# Patient Record
Sex: Female | Born: 1935 | Race: White | Hispanic: No | Marital: Married | Smoking: Never smoker
Health system: Southern US, Community
[De-identification: ages and names within clinical notes are randomized; demographics above are authoritative.]

## PROBLEM LIST (undated history)

## (undated) DIAGNOSIS — N393 Stress incontinence (female) (male): Secondary | ICD-10-CM

## (undated) DIAGNOSIS — K635 Polyp of colon: Secondary | ICD-10-CM

## (undated) DIAGNOSIS — I272 Pulmonary hypertension, unspecified: Secondary | ICD-10-CM

## (undated) DIAGNOSIS — R42 Dizziness and giddiness: Secondary | ICD-10-CM

## (undated) DIAGNOSIS — C50919 Malignant neoplasm of unspecified site of unspecified female breast: Secondary | ICD-10-CM

## (undated) DIAGNOSIS — I10 Essential (primary) hypertension: Secondary | ICD-10-CM

## (undated) DIAGNOSIS — K219 Gastro-esophageal reflux disease without esophagitis: Secondary | ICD-10-CM

## (undated) DIAGNOSIS — M1990 Unspecified osteoarthritis, unspecified site: Secondary | ICD-10-CM

## (undated) DIAGNOSIS — Z8619 Personal history of other infectious and parasitic diseases: Secondary | ICD-10-CM

## (undated) DIAGNOSIS — C801 Malignant (primary) neoplasm, unspecified: Secondary | ICD-10-CM

## (undated) DIAGNOSIS — I5032 Chronic diastolic (congestive) heart failure: Secondary | ICD-10-CM

## (undated) DIAGNOSIS — I8390 Asymptomatic varicose veins of unspecified lower extremity: Secondary | ICD-10-CM

## (undated) DIAGNOSIS — J45909 Unspecified asthma, uncomplicated: Secondary | ICD-10-CM

## (undated) DIAGNOSIS — I071 Rheumatic tricuspid insufficiency: Secondary | ICD-10-CM

## (undated) DIAGNOSIS — I4891 Unspecified atrial fibrillation: Secondary | ICD-10-CM

## (undated) DIAGNOSIS — N1832 Chronic kidney disease, stage 3b: Secondary | ICD-10-CM

## (undated) DIAGNOSIS — I4819 Other persistent atrial fibrillation: Secondary | ICD-10-CM

## (undated) HISTORY — DX: Polyp of colon: K63.5

## (undated) HISTORY — DX: Dizziness and giddiness: R42

## (undated) HISTORY — DX: Personal history of other infectious and parasitic diseases: Z86.19

## (undated) HISTORY — DX: Asymptomatic varicose veins of unspecified lower extremity: I83.90

## (undated) HISTORY — DX: Unspecified atrial fibrillation: I48.91

## (undated) HISTORY — DX: Unspecified osteoarthritis, unspecified site: M19.90

## (undated) HISTORY — PX: BREAST SURGERY: SHX581

## (undated) HISTORY — DX: Malignant (primary) neoplasm, unspecified: C80.1

## (undated) HISTORY — DX: Unspecified asthma, uncomplicated: J45.909

## (undated) HISTORY — DX: Gastro-esophageal reflux disease without esophagitis: K21.9

## (undated) HISTORY — DX: Essential (primary) hypertension: I10

## (undated) HISTORY — DX: Malignant neoplasm of unspecified site of unspecified female breast: C50.919

## (undated) HISTORY — DX: Stress incontinence (female) (male): N39.3

---

## 1970-09-07 HISTORY — PX: ABDOMINAL HYSTERECTOMY: SHX81

## 1997-12-06 ENCOUNTER — Encounter: Admission: RE | Admit: 1997-12-06 | Discharge: 1998-03-06 | Payer: Self-pay | Admitting: Radiation Oncology

## 1998-04-01 ENCOUNTER — Other Ambulatory Visit: Admission: RE | Admit: 1998-04-01 | Discharge: 1998-04-01 | Payer: Self-pay | Admitting: Hematology and Oncology

## 1998-05-20 ENCOUNTER — Ambulatory Visit (HOSPITAL_COMMUNITY): Admission: RE | Admit: 1998-05-20 | Discharge: 1998-05-20 | Payer: Self-pay | Admitting: Family Medicine

## 1999-10-20 ENCOUNTER — Encounter: Admission: RE | Admit: 1999-10-20 | Discharge: 1999-10-20 | Payer: Self-pay | Admitting: *Deleted

## 1999-10-20 ENCOUNTER — Encounter: Payer: Self-pay | Admitting: Family Medicine

## 2000-04-21 ENCOUNTER — Encounter: Admission: RE | Admit: 2000-04-21 | Discharge: 2000-04-21 | Payer: Self-pay | Admitting: *Deleted

## 2000-04-21 ENCOUNTER — Encounter: Payer: Self-pay | Admitting: *Deleted

## 2000-04-27 ENCOUNTER — Ambulatory Visit (HOSPITAL_COMMUNITY): Admission: RE | Admit: 2000-04-27 | Discharge: 2000-04-27 | Payer: Self-pay | Admitting: *Deleted

## 2000-07-22 ENCOUNTER — Ambulatory Visit (HOSPITAL_COMMUNITY): Admission: RE | Admit: 2000-07-22 | Discharge: 2000-07-22 | Payer: Self-pay | Admitting: Gastroenterology

## 2000-10-19 ENCOUNTER — Encounter: Admission: RE | Admit: 2000-10-19 | Discharge: 2000-10-19 | Payer: Self-pay | Admitting: *Deleted

## 2000-10-19 ENCOUNTER — Encounter: Payer: Self-pay | Admitting: *Deleted

## 2001-04-22 ENCOUNTER — Encounter: Payer: Self-pay | Admitting: *Deleted

## 2001-04-22 ENCOUNTER — Encounter: Admission: RE | Admit: 2001-04-22 | Discharge: 2001-04-22 | Payer: Self-pay | Admitting: *Deleted

## 2001-04-27 ENCOUNTER — Ambulatory Visit (HOSPITAL_COMMUNITY): Admission: RE | Admit: 2001-04-27 | Discharge: 2001-04-27 | Payer: Self-pay | Admitting: *Deleted

## 2001-04-27 ENCOUNTER — Encounter: Payer: Self-pay | Admitting: *Deleted

## 2001-09-20 ENCOUNTER — Ambulatory Visit (HOSPITAL_COMMUNITY): Admission: RE | Admit: 2001-09-20 | Discharge: 2001-09-20 | Payer: Self-pay | Admitting: Family Medicine

## 2001-09-20 ENCOUNTER — Encounter: Payer: Self-pay | Admitting: Family Medicine

## 2002-04-25 ENCOUNTER — Encounter: Admission: RE | Admit: 2002-04-25 | Discharge: 2002-04-25 | Payer: Self-pay | Admitting: Family Medicine

## 2002-04-25 ENCOUNTER — Encounter: Payer: Self-pay | Admitting: Family Medicine

## 2002-10-27 ENCOUNTER — Other Ambulatory Visit: Admission: RE | Admit: 2002-10-27 | Discharge: 2002-10-27 | Payer: Self-pay | Admitting: Family Medicine

## 2003-06-19 ENCOUNTER — Encounter: Admission: RE | Admit: 2003-06-19 | Discharge: 2003-06-19 | Payer: Self-pay | Admitting: Family Medicine

## 2003-06-19 ENCOUNTER — Encounter: Payer: Self-pay | Admitting: Family Medicine

## 2004-07-16 ENCOUNTER — Encounter: Admission: RE | Admit: 2004-07-16 | Discharge: 2004-07-16 | Payer: Self-pay | Admitting: Family Medicine

## 2005-07-20 ENCOUNTER — Encounter: Admission: RE | Admit: 2005-07-20 | Discharge: 2005-07-20 | Payer: Self-pay | Admitting: Family Medicine

## 2006-05-21 ENCOUNTER — Inpatient Hospital Stay (HOSPITAL_COMMUNITY): Admission: EM | Admit: 2006-05-21 | Discharge: 2006-05-22 | Payer: Self-pay | Admitting: *Deleted

## 2008-04-19 ENCOUNTER — Encounter: Admission: RE | Admit: 2008-04-19 | Discharge: 2008-04-19 | Payer: Self-pay | Admitting: Family Medicine

## 2009-05-03 ENCOUNTER — Encounter: Admission: RE | Admit: 2009-05-03 | Discharge: 2009-05-03 | Payer: Self-pay | Admitting: Family Medicine

## 2009-11-12 ENCOUNTER — Encounter: Admission: RE | Admit: 2009-11-12 | Discharge: 2009-11-12 | Payer: Self-pay | Admitting: Family Medicine

## 2011-01-23 NOTE — Procedures (Signed)
[place]. [facility]  Patient:    Karina Chase, Karina Chase                     MRN: [id number] Proc. Date: 07/22/00 Adm. Date:  [id number] Attending:  Orland Mustard CC:         Willis Modena. Dreiling, M.D.   Procedure Report  PROCEDURE:  Esophagogastroduodenoscopy.  MEDICATIONS:  Hurricane spray, fentanyl 50 mcg, Versed 5 mg IV.  INDICATIONS:  Persistent history of esophageal reflux symptoms.  DESCRIPTION OF PROCEDURE:  The procedure had been explained to the patient and consent obtained.  With the patient in the left lateral decubitus position the Olympus video endoscope was inserted and advanced under direct visualization.  The stomach was entered, pylorus identified and passed.  The duodenum, including the bulb and second portion, were seen well.  The pyloric channel was identified and passed.  The duodenum, duodenal bulb and second portion were normal.  The scope was withdrawn into the stomach.  There was no gastric outlet obstruction.  The antrum was normal.  The fundus and cardia seen well in retroflexed view and were normal.  The body of the stomach was normal.  There was a hiatal hernia.  The wire passed in patchulous GE junction.  The distal esophagus was essentially normal.  The proximal esophagus seen well upon removal and was normal.  The patient tolerated the procedure well.  She was maintained on low-flow oxygen and pulse oximetry throughout the procedure, with no obvious problems.  ASSESSMENT:  Hiatal hernia with probable esophageal reflux disease.  No other obvious abnormalities.  PLAN: 1. Will continue the patient on Prevacid.  Will give out reflux sheet. She    is to be seen back as needed. 2. Will proceed at this time with colonoscopy as planned. DD:  07/22/00 TD:  07/22/00 Job: [id number]

## 2011-01-23 NOTE — Discharge Summary (Signed)
NAMESIDRA, Karina Chase              ACCOUNT NO.:  [id number]   MEDICAL RECORD NO.:  [id number]          PATIENT TYPE:  INP   LOCATION:  3729                         FACILITY:  [facility]   PHYSICIAN:  Armanda Magic, M.D.     DATE OF BIRTH:  06-23-1925   DATE OF ADMISSION:  05/21/2006  DATE OF DISCHARGE:  05/22/2006                                 DISCHARGE SUMMARY   DISCHARGE DIAGNOSIS:  1. Paroxysmal atrial fibrillation with spontaneous conversion to normal      sinus rhythm.  2. Chest pain, resolved, adenosine Cardiolite shows no ischemia, ejection      fraction 66%, there is a possible inferior wall area of hypokinesis.  3. Hypertension.  4. Long term medication use.  5. Early family history of coronary artery disease.  6. Allergy to PENICILLIN.  7. Reflux, hiatal hernia.  8. History of breast cancer status post radiation treatment, no      chemotherapy treatment.   Karina Chase is a 77 year old female with no known history of coronary  disease who awoke at 3:00 a.m. on the day of admission with palpitations and  substernal chest pain.  No other symptoms.  She presented to emergency room  at 9 a.m. with an EKG showing atrial fibrillation with ventricular response  in the 140s and then she spontaneously converted to normal sinus rhythm.  Since that time, she has been pain free.  She had had another episode  several years ago very similar to this.  At that time, an outpatient  Cardiolite was negative.   The patient remained in the hospital overnight and an adenosine Cardiolite  was negative for ischemia.  In addition, cardiac isoenzymes were negative.  Total cholesterol 174, triglycerides 110, HDL 46, LDL 106.  TSH 3.530.  The  patient was monitored in the hospital overnight and with the Cardiolite  being negative, it was felt that the patient could go home.   DISCHARGE MEDICATIONS:  Enteric coated aspirin 325 mg one tablet daily,  Norvasc 5 mg a day.   DISCHARGE INSTRUCTIONS:   Follow up with Dr. Eldridge Dace in 1-2 weeks with an  echo and an office visit.  She is to call for any questions or concerns.     Guy Franco, P.A.      Armanda Magic, M.D.  Electronically Signed   LB/MEDQ  D:  06/30/2006  T:  07/01/2006  Job:  [id number]   cc:   Corky Crafts, MD

## 2011-01-23 NOTE — Procedures (Signed)
[place]. [facility]  Patient:    Karina Chase, Karina Chase                     MRN: [id number] Proc. Date: 07/22/00 Adm. Date:  [id number] Attending:  Orland Mustard CC:         Willis Modena. Dreiling, M.D.                           Procedure Report  PROCEDURE PERFORMED:  Colonoscopy.  ENDOSCOPIST:  Llana Aliment. Randa Evens, M.D.  MEDICATIONS USED:  Patient received a total of fentanyl 80 mcg and Versed 6 mg for both procedurs.  INDICATIONS:  Strong family history of colon cancer in a sister.  DESCRIPTION OF PROCEDURE:  The procedure had been explained to the patient and consent obtained.  With the patient in the left lateral decubitus position, the Olympus pediatric video colonoscope was inserted and advanced under direct visualization.  The prep was excellent and we were able to advance to the cecum using abdominal pressure and position changes.  The ileocecal valve was seen.  We advanced just to the top of the ileocecal valve and nothing was seen grossly in the distance in the cecum.  The scope was withdrawn.  The cecum, ascending colon, hepatic flexure, transverse colon, splenic flexure, descending and sigmoid colon were seen well.  There were scattered diverticula.  No polyps or other lesions were seen.  Scope withdrawn, patient tolerated the procedure well.  Maintained on low flow oxygen and pulse oximeter through both procedures without abnormalities.  ASSESSMENT:  No evidence of colon polyps.  PLAN:  Due to her family history will recommend repeating in five years.    PLAN: DD:  07/22/00 TD:  07/22/00 Job: [id number]

## 2012-09-07 HISTORY — PX: SKIN CANCER EXCISION: SHX779

## 2013-03-16 ENCOUNTER — Encounter (INDEPENDENT_AMBULATORY_CARE_PROVIDER_SITE_OTHER): Payer: Self-pay

## 2013-03-28 ENCOUNTER — Telehealth (INDEPENDENT_AMBULATORY_CARE_PROVIDER_SITE_OTHER): Payer: Self-pay | Admitting: General Surgery

## 2013-03-28 ENCOUNTER — Ambulatory Visit (INDEPENDENT_AMBULATORY_CARE_PROVIDER_SITE_OTHER): Payer: Medicare Other | Admitting: General Surgery

## 2013-03-28 ENCOUNTER — Encounter (INDEPENDENT_AMBULATORY_CARE_PROVIDER_SITE_OTHER): Payer: Self-pay | Admitting: General Surgery

## 2013-03-28 VITALS — BP 138/80 | HR 76 | Temp 97.8°F | Resp 15 | Ht 63.0 in | Wt 162.4 lb

## 2013-03-28 DIAGNOSIS — R1031 Right lower quadrant pain: Secondary | ICD-10-CM

## 2013-03-28 HISTORY — DX: Right lower quadrant pain: R10.31

## 2013-03-28 NOTE — Progress Notes (Signed)
Patient ID: Karina Chase, female   DOB: 15-Sep-1924, 79 y.o.   MRN: [id number]  Chief Complaint  Patient presents with  . New Evaluation    eval poss femoral hernia?    HPI Karina Chase is a 79 y.o. female.  She is referred by [name] for evaluation of right groin pain, possible hernia.  This patient has a history of breast cancer. She underwent lumpectomy by me in 1998 and had radiation therapy. No recurrence noted to date.  She states that she has had intermittent pain in her right groin for about 2 years she thinks she feels a small lump for about 2 months. This bothers her later in the day and  seems to be getting a little bit worse and is now seems to be referred to the vagina. She denies back pain but rarely has right buttock pain.  She saw [name] who thought he felt a hernia. Pelvic exam was not possible due to an atrophic introitus. She is here today with her daughter.  HPI  Past Medical History  Diagnosis Date  . Hypertension   . Asthma   . Varicose veins   . Cancer     Basal cell skin cancer  . Breast cancer   . GERD (gastroesophageal reflux disease)   . Stress incontinence   . Vertigo   . History of shingles   . Atrial fibrillation     intermittent  . Colon polyp   . Arthritis     Past Surgical History  Procedure Laterality Date  . Abdominal hysterectomy  1972    BSO  . Breast surgery      lumpectomy  . Skin cancer excision  2014    Family History  Problem Relation Age of Onset  . Stroke Mother   . Heart disease Father   . Cancer Maternal Grandmother     breast    Social History History  Substance Use Topics  . Smoking status: Never Smoker   . Smokeless tobacco: Never Used  . Alcohol Use: No    Allergies  Allergen Reactions  . Keflex (Cephalexin)   . Penicillins   . Prilosec (Omeprazole)     Current Outpatient Prescriptions  Medication Sig Dispense Refill  . amLODipine (NORVASC) 5 MG tablet Take 5 mg by mouth daily.       Marland Kitchen aspirin 325 MG tablet Take 325 mg by mouth daily.      . Clobetasol Prop Emollient Base 0.05 % emollient cream Apply topically 2 (two) times daily.      Marland Kitchen diltiazem (CARDIZEM CD) 120 MG 24 hr capsule Take 120 mg by mouth daily.      . nitroGLYCERIN (NITROSTAT) 0.4 MG SL tablet Place 0.4 mg under the tongue every 5 (five) minutes as needed for chest pain.       No current facility-administered medications for this visit.    Review of Systems Review of Systems  Constitutional: Negative for fever, chills and unexpected weight change.  HENT: Negative for hearing loss, congestion, sore throat, trouble swallowing and voice change.   Eyes: Negative for visual disturbance.  Respiratory: Negative for cough and wheezing.   Cardiovascular: Negative for chest pain, palpitations and leg swelling.  Gastrointestinal: Negative for nausea, vomiting, abdominal pain, diarrhea, constipation, blood in stool, abdominal distention and anal bleeding.  Genitourinary: Negative for hematuria, vaginal bleeding and difficulty urinating.  Musculoskeletal: Positive for myalgias and arthralgias.  Skin: Negative for rash and wound.  Neurological: Negative  for seizures, syncope and headaches.  Hematological: Negative for adenopathy. Does not bruise/bleed easily.  Psychiatric/Behavioral: Negative for confusion.    Blood pressure 138/80, pulse 76, temperature 97.8 F (36.6 C), temperature source Temporal, resp. rate 15, height 5\' 3"  (1.6 m), weight 162 lb 6.4 oz (73.664 kg).  Physical Exam Physical Exam  Constitutional: She is oriented to person, place, and time. She appears well-developed and well-nourished. No distress.  HENT:  Head: Normocephalic and atraumatic.  Nose: Nose normal.  Mouth/Throat: No oropharyngeal exudate.  Eyes: Conjunctivae and EOM are normal. Pupils are equal, round, and reactive to light. Left eye exhibits no discharge. No scleral icterus.  Neck: Neck supple. No JVD present. No tracheal  deviation present. No thyromegaly present.  Cardiovascular: Intact distal pulses.   Murmur heard. Somewhat irregular rhythm. Soft systolic murmur.  Pulmonary/Chest: Effort normal and breath sounds normal. No respiratory distress. She has no wheezes. She has no rales. She exhibits no tenderness.  Abdominal: Soft. Bowel sounds are normal. She exhibits no distension and no mass. There is no tenderness. There is no rebound and no guarding.  Lower midline scar. No mass or hernia. No tenderness.  Genitourinary:  Examined supine and standing, cough and Valsalva. I cannot detect a hernia in either femoral or inguinal. She is somewhat tender near the right pubic tubercle. Excellent femoral pulse but no mass or hernia medial to that. No inguinal mass in either. Left inguinal area unremarkable exam.  Musculoskeletal: She exhibits no edema and no tenderness.  Lymphadenopathy:    She has no cervical adenopathy.  Neurological: She is alert and oriented to person, place, and time. She exhibits normal muscle tone. Coordination normal.  Skin: Skin is warm. No rash noted. She is not diaphoretic. No erythema. No pallor.  Psychiatric: She has a normal mood and affect. Her behavior is normal. Judgment and thought content normal.    Data Reviewed Dr. Johnathan Hausen office notes.  Assessment    Right groin pain. Etiology unclear. Occult hernia possible but not demonstrated on exam. Musculoskeletal pain likely.  Intermittent atrial fibrillation  Hypertension  Asthma  History of breast cancer  Status post abdominal hysterectomy  GERD     Plan    Since her symptoms are progressive, I think that further evaluation is warranted.  She'll be scheduled for CT scan of the pelvis with contrast to make sure we are not missing an abdominal wall hernia or a bony abnormality  She was advised to contact [name] for referral to a gynecologist for a pelvic exam  Return to see me after the CT scan is done.         Angelia Mould. Derrell Lolling, M.D., [facility] General and Minimally invasive Surgery Breast and Colorectal Surgery Office:   [phone] Pager:   [phone]  03/28/2013, 3:02 PM

## 2013-03-28 NOTE — Patient Instructions (Signed)
Examination today does not reveal an obvious hernia.  Because your right groin pain and vaginal pain have been getting worse, I think that further evaluation is indicated  You will be scheduled for a CT scan of the pelvis to make sure that we're not missing a hernia or some bony abnormality.  Call [name] and ask him to refer you to a gynecologist to make sure there is no vaginal problem.  Return to see Dr. Derrell Lolling after the CT scan is done.

## 2013-03-28 NOTE — Telephone Encounter (Signed)
LMOM for patient to call back and ask for triage. To make her aware per Deanna, RN, that patient is set up for her CT pelvis on 03/31/2013 at 2:30 pm at [facility]. Patient needs to be instructed to drink contrast at 12:30 pm and 1:30 pm and no solid food after 10:30 am. Awaiting call back.

## 2013-03-29 NOTE — Telephone Encounter (Signed)
The pt called back and I gave her the CT information.  She understands and will call back if she can't get a ride there.

## 2013-03-31 ENCOUNTER — Other Ambulatory Visit: Payer: Self-pay

## 2013-03-31 ENCOUNTER — Ambulatory Visit
Admission: RE | Admit: 2013-03-31 | Discharge: 2013-03-31 | Disposition: A | Discharge status: Home / Self Care | Payer: Medicare Other | Source: Ambulatory Visit | Attending: General Surgery | Admitting: General Surgery

## 2013-03-31 DIAGNOSIS — R1031 Right lower quadrant pain: Secondary | ICD-10-CM

## 2013-03-31 MED ORDER — IOHEXOL 300 MG/ML  SOLN
100.0000 mL | Freq: Once | INTRAMUSCULAR | Status: AC | PRN
  Administered 2013-03-31: 100 mL via INTRAVENOUS

## 2013-04-03 ENCOUNTER — Telehealth (INDEPENDENT_AMBULATORY_CARE_PROVIDER_SITE_OTHER): Payer: Self-pay

## 2013-04-03 NOTE — Telephone Encounter (Signed)
Message copied by Ivory Broad on Mon Apr 03, 2013  9:23 AM ------      Message from: Ernestene Mention      Created: Fri Mar 31, 2013  5:25 PM       Call radiology reports to patient. ------

## 2013-04-03 NOTE — Telephone Encounter (Signed)
Patient informed of negative Ct results. Patient states she is going to see her GYN so we can cancel her appointment as advised.

## 2013-04-03 NOTE — Telephone Encounter (Signed)
I left a message for the pt to call and I can tell her the CT was normal.  There is no inguinal hernia and no explanation for her inguinal or vaginal pain.  She has a follow up appointment with Dr Derrell Lolling and I will leave it up to her if she wants to keep it.

## 2013-04-04 ENCOUNTER — Telehealth (INDEPENDENT_AMBULATORY_CARE_PROVIDER_SITE_OTHER): Payer: Self-pay | Admitting: *Deleted

## 2013-04-04 NOTE — Telephone Encounter (Signed)
Patient called today and asked to speak with Lubertha Basque and only Lubertha Basque.  Patient states she wants to talk to Huntley Dec RN more about CT scan results.  This RN attempted to speak with patient however she declined.  Patient aware Lubertha Basque is working with a doctor this morning and will not be able to call back until at least this afternoon.  Patient states understanding and agreeable at this time.

## 2013-04-04 NOTE — Telephone Encounter (Signed)
I called the pt back.  She wanted to know whether her CT scan showed anything abnormal and I told her it didn't.  She is going to see her gyn and wants to know if she will be able to see the results.  I told her she can if she is in the same charting system otherwise she can call [facility] [facility] for the films.  She reports her gyn is in the [address] building.

## 2013-04-19 NOTE — Telephone Encounter (Signed)
Erroneous encounter

## 2013-04-25 ENCOUNTER — Encounter (INDEPENDENT_AMBULATORY_CARE_PROVIDER_SITE_OTHER): Payer: Medicare Other | Admitting: General Surgery

## 2013-09-18 ENCOUNTER — Other Ambulatory Visit: Payer: Self-pay

## 2013-09-18 DIAGNOSIS — Z1231 Encounter for screening mammogram for malignant neoplasm of breast: Secondary | ICD-10-CM

## 2013-10-05 ENCOUNTER — Ambulatory Visit
Admission: RE | Admit: 2013-10-05 | Discharge: 2013-10-05 | Disposition: A | Discharge status: Home / Self Care | Payer: Medicare Other | Source: Ambulatory Visit

## 2013-10-05 DIAGNOSIS — Z1231 Encounter for screening mammogram for malignant neoplasm of breast: Secondary | ICD-10-CM

## 2015-07-03 ENCOUNTER — Ambulatory Visit: Payer: Self-pay | Admitting: Sports Medicine

## 2015-07-04 ENCOUNTER — Ambulatory Visit (INDEPENDENT_AMBULATORY_CARE_PROVIDER_SITE_OTHER): Payer: Medicare Other

## 2015-07-04 ENCOUNTER — Encounter: Payer: Self-pay | Admitting: Sports Medicine

## 2015-07-04 ENCOUNTER — Ambulatory Visit (INDEPENDENT_AMBULATORY_CARE_PROVIDER_SITE_OTHER): Payer: Medicare Other | Admitting: Sports Medicine

## 2015-07-04 DIAGNOSIS — M21619 Bunion of unspecified foot: Secondary | ICD-10-CM

## 2015-07-04 DIAGNOSIS — L84 Corns and callosities: Secondary | ICD-10-CM | POA: Diagnosis not present

## 2015-07-04 DIAGNOSIS — M79673 Pain in unspecified foot: Secondary | ICD-10-CM

## 2015-07-04 DIAGNOSIS — M2040 Other hammer toe(s) (acquired), unspecified foot: Secondary | ICD-10-CM

## 2015-07-04 NOTE — Progress Notes (Signed)
Patient ID: Karina Chase, female   DOB: 12/30/1924, 81 y.o.   MRN: [id number] Subjective: Karina Chase is a 81 y.o. female patient who presents to office for evaluation of bilateral foot pain. Patient complains of pain at the lesions present Right>Left foot especially in the 1st interspace. Patient has tried trimming and padding with no relief in symptoms. Patient states that she has a few pairs of shoes that help make the pain over the callus areas better. Patient denies any other pedal complaints.   Review of Systems  Cardiovascular:       High , low BP   Musculoskeletal:       Swelling of feet or legs   Hematological: Bruises/bleeds easily.  All other systems reviewed and are negative.  Patient Active Problem List   Diagnosis Date Noted  . Right groin pain 03/28/2013   Current Outpatient Prescriptions on File Prior to Visit  Medication Sig Dispense Refill  . amLODipine (NORVASC) 5 MG tablet Take 5 mg by mouth daily.    Marland Kitchen aspirin 325 MG tablet Take 325 mg by mouth daily.    . Clobetasol Prop Emollient Base 0.05 % emollient cream Apply topically 2 (two) times daily.    Marland Kitchen diltiazem (CARDIZEM CD) 120 MG 24 hr capsule Take 120 mg by mouth daily.    . nitroGLYCERIN (NITROSTAT) 0.4 MG SL tablet Place 0.4 mg under the tongue every 5 (five) minutes as needed for chest pain.     No current facility-administered medications on file prior to visit.   Allergies  Allergen Reactions  . Levofloxacin Nausea And Vomiting  . Keflex [Cephalexin]   . Penicillins   . Prilosec [Omeprazole]    Objective:  General: Alert and oriented x3 in no acute distress  Dermatology: Keratotic lesions present on the right foot at dorsal aspect of 5th toe, 1st interspace at lateral aspect of hallux, plantar 2nd metatarsal, and medial 1st MPTJ/hallux and on the left at plantar 5th metatarsal head and medial 1st MTPJ/hallux with no signs of infection, no webspace macerations, no ecchymosis bilateral, all nails x  10 are short and well manicured.  Vascular: Dorsalis Pedis and Posterior Tibial pedal pulses1/4, Capillary Fill Time 4 seconds, Scant pedal hair growth bilateral, mild varicosities, no edema bilateral lower extremities, Temperature gradient within normal limits.  Neurology: Johney Maine sensation intact via light touch bilateral.  Musculoskeletal: Significant bunion with crossover onto 2nd toe right>left, rigid hammertoes bilateral with distal fat pad migration, Mild tenderness with direct palpation at the keratotic lesions  Right>Left, Muscular strength 5/5 in all groups without pain or limitation on range of motion.   Xrays  Right & left Foot 3 views    Impression: decreased osseous mineralization, significant bunion and hammertoe deformities, calcaneal spur, pes planus foot type, no fracture/dislocation, no boney destruction, no foreign body, soft tissues within normal limits.         Assessment and Plan: Problem List Items Addressed This Visit    None    Visit Diagnoses    Foot pain, unspecified laterality    -  Primary    Relevant Orders    DG Foot Complete Left (Completed)    DG Foot Complete Right (Completed)    Corns and callus        Bunion        Hammertoe, unspecified laterality          -Complete examination performed -Xrays reviewed -Discussed etiology of corns and callus secondary to foot deformities and treatement  options; risks, benefits, alternatives explained  -Parred keratoic lesions x6 using a chisel blade without incident and applied salinocaine covered with moleskin to right sub 2 lesion and left sub 5 lesion -Dispensed toe tubing and silicone toe pad to use daily  -Recommend cont with good supportive shoes -Patient to return to office 10 weeks for callus care or sooner if condition worsens. May consider custom accommodative inserts in future.   Landis Martins, DPM

## 2015-07-04 NOTE — Progress Notes (Deleted)
   Subjective:    Patient ID: Karina Chase, female    DOB: 1925-04-19, 81 y.o.   MRN: [id number]  HPI    Review of Systems  Cardiovascular:       High , low BP   Musculoskeletal:       Swelling of feet or legs   Hematological: Bruises/bleeds easily.  All other systems reviewed and are negative.      Objective:   Physical Exam        Assessment & Plan:

## 2015-07-24 IMAGING — CT CT PELVIS W/ CM
3 series · 13 of 36 positions shown, 19 images · IV contrast (omnipaque)
Comparison: None.

CLINICAL DATA: Right inguinal and vaginal pain.  Prior
hysterectomy.  History of right breast cancer.

CT PELVIS WITH CONTRAST
TECHNIQUE: Multidetector CT imaging of the pelvis was performed
using the standard protocol following the bolus administration of
intravenous contrast.
Contrast: 100mL OMNIPAQUE IOHEXOL 300 MG/ML  SOLN

[Series 3: routine pelvis · axial · 0.98mm/px · z∈[-275,-90]mm · 5 of 54 slices shown, 10 images]
[im 9/54  soft-tissue]
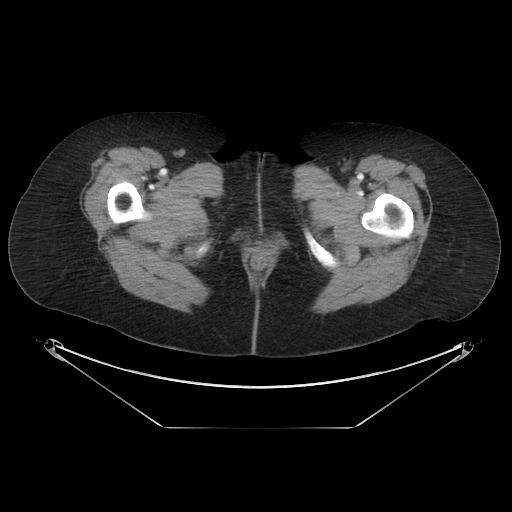
[im 9/54  bone]
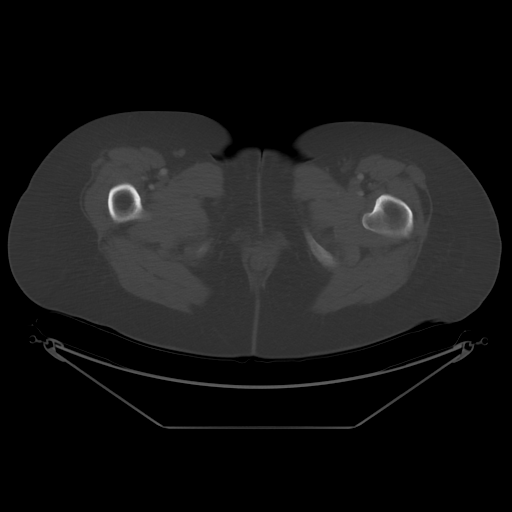
[im 18/54  soft-tissue]
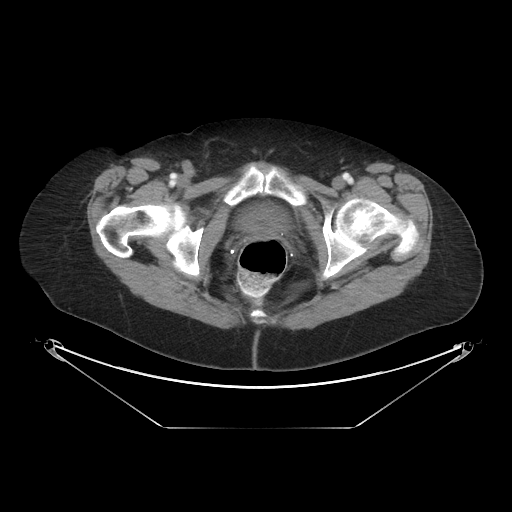
[im 18/54  lung]
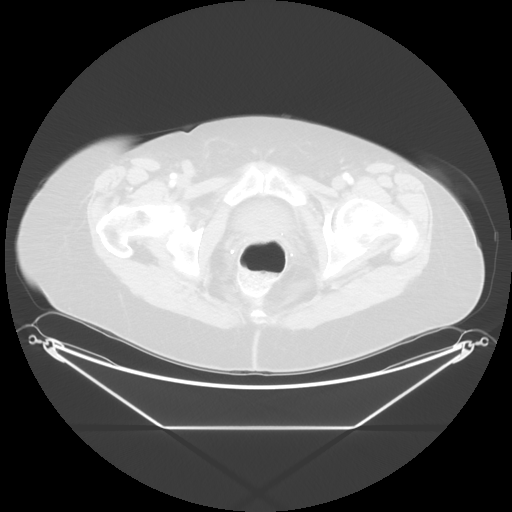
[im 27/54  soft-tissue]
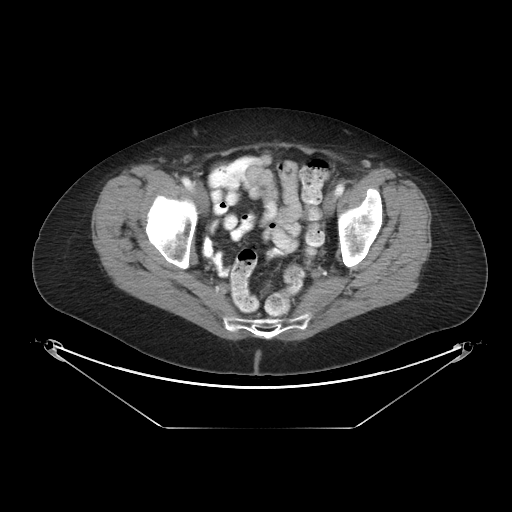
[im 27/54  lung]
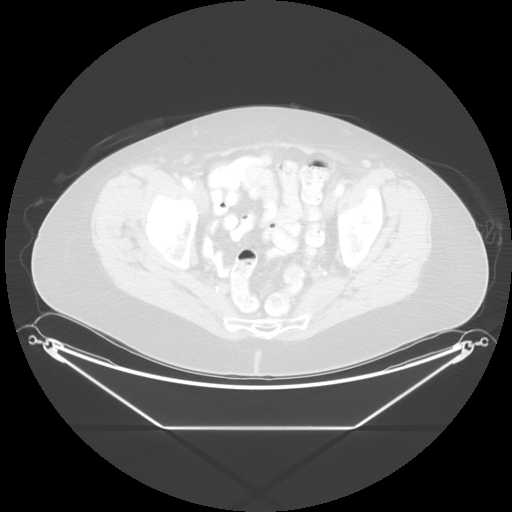
[im 36/54  soft-tissue]
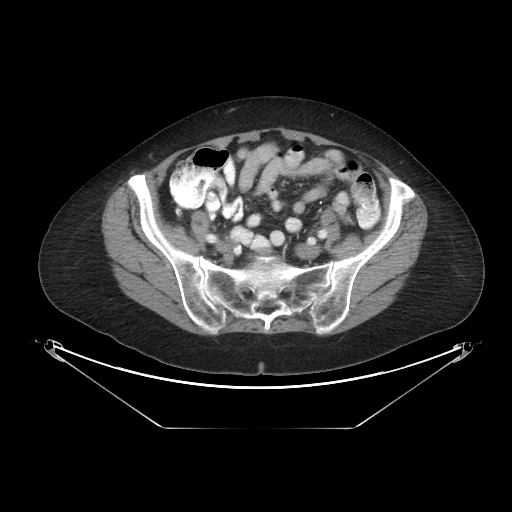
[im 36/54  lung]
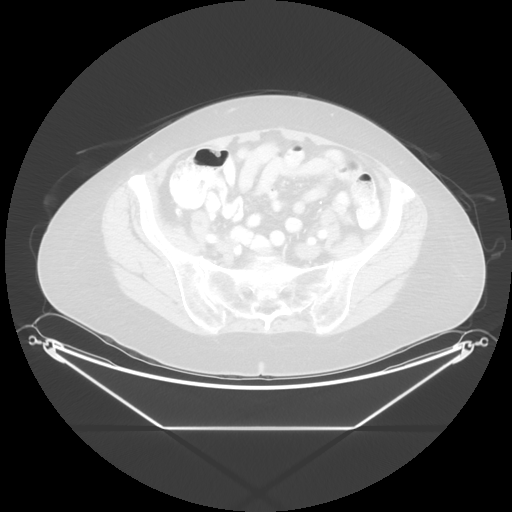
[im 45/54  soft-tissue]
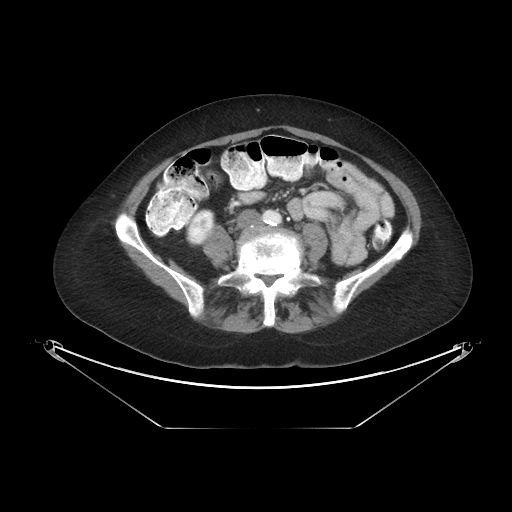
[im 45/54  lung]
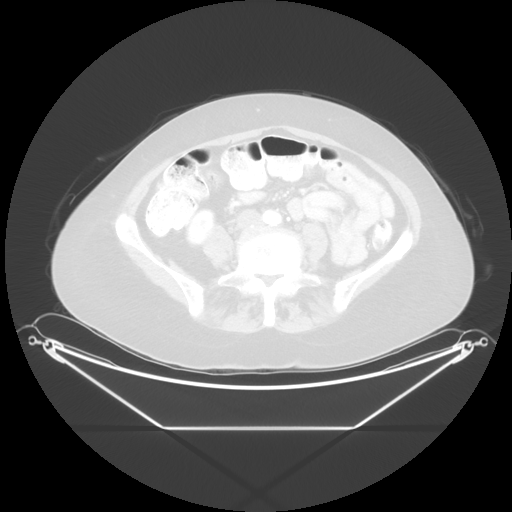

[Series 601: coronal body · coronal · 0.98mm/px · 1 of 136 slices shown, 2 images]
[im 46/136  soft-tissue]
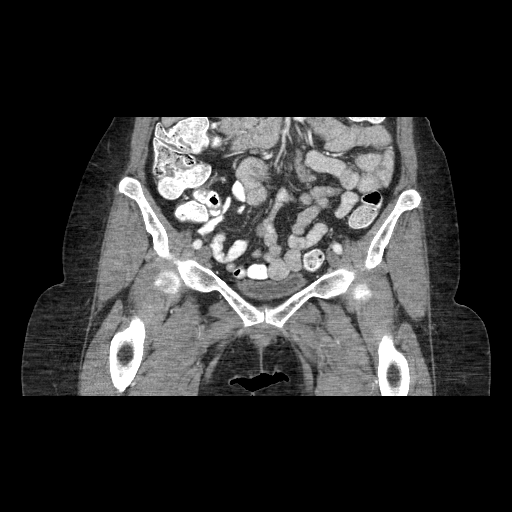
[im 46/136  bone]
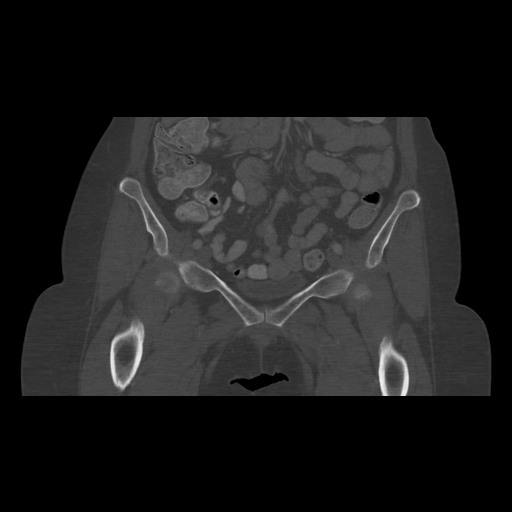

[Series 602: sagittal body · sagittal · 0.98mm/px · 7 of 194 slices shown]
[im 17/194  soft-tissue]
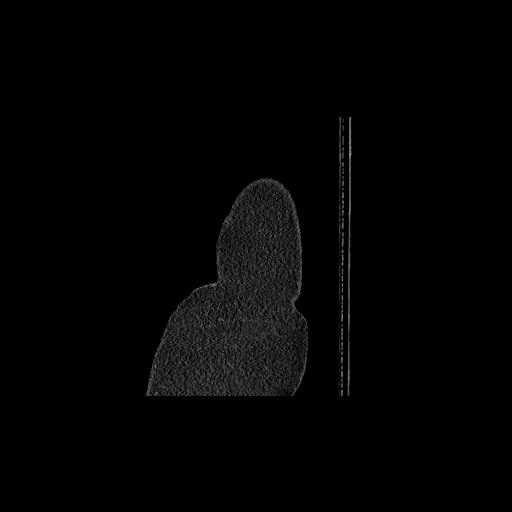
[im 41/194  soft-tissue]
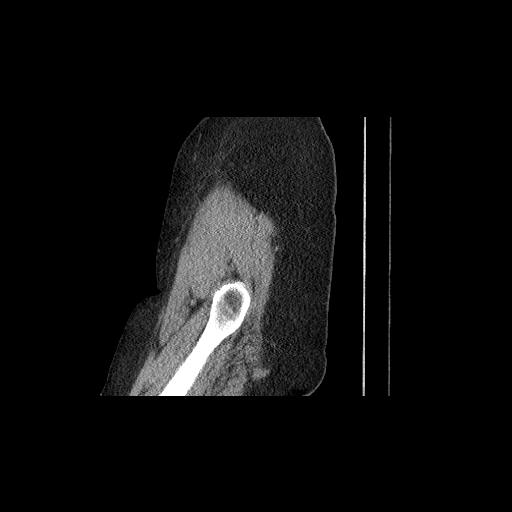
[im 65/194  soft-tissue]
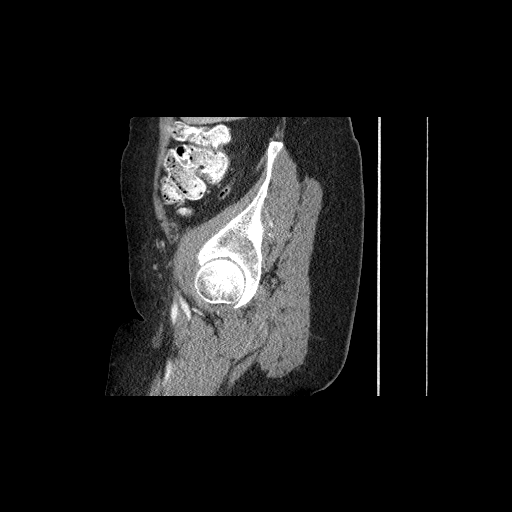
[im 89/194  soft-tissue]
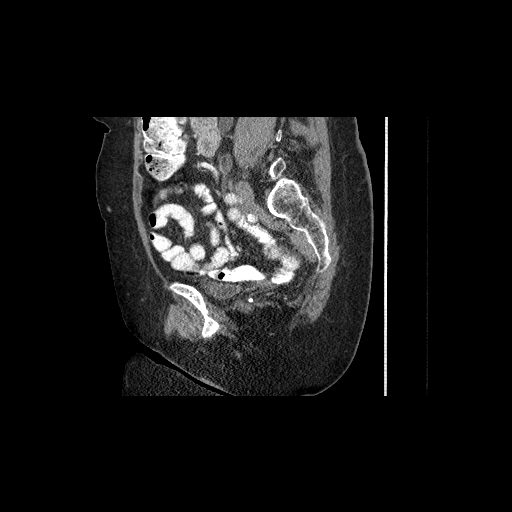
[im 105/194  soft-tissue]
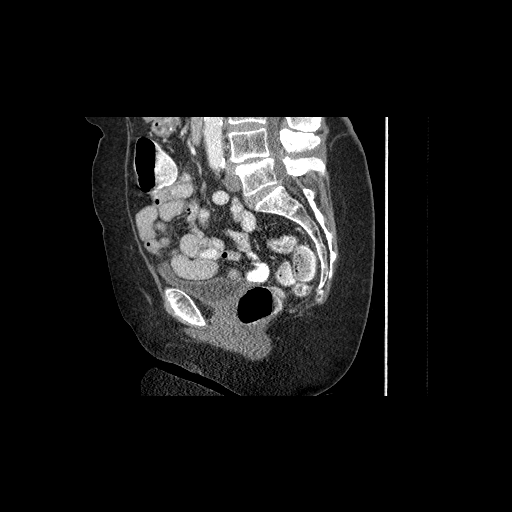
[im 129/194  soft-tissue]
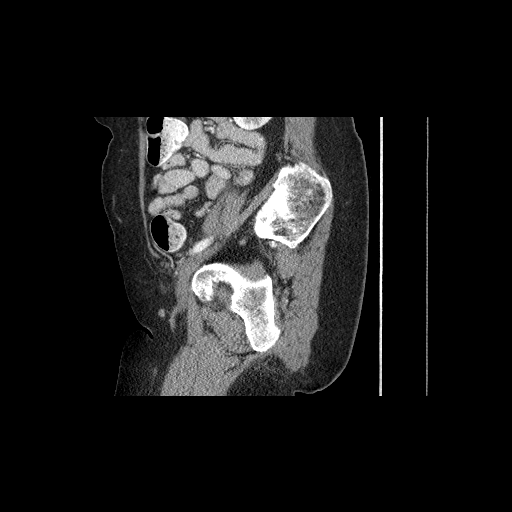
[im 153/194  soft-tissue]
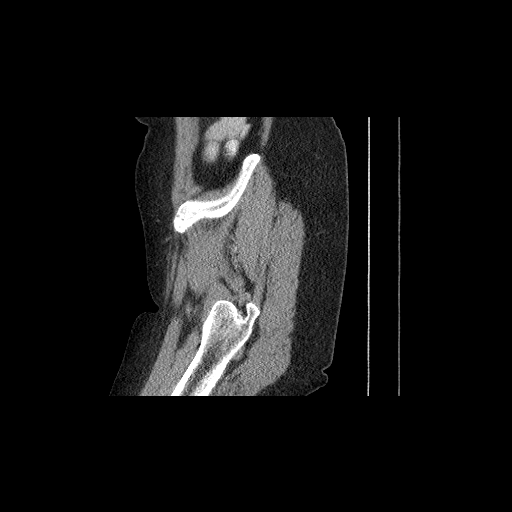

[13 of 36 positions shown; findings below may reference images not displayed]

FINDINGS: Visualized large and small bowel are unremarkable.  No
evidence of bowel obstruction.  Normal retrocecal appendix.  No
free fluid, free air or adenopathy in the lower abdomen or pelvis.

Prior hysterectomy.  No adnexal masses.  Urinary bladder has a
normal appearance.

Scattered calcifications in the distal aorta and iliac vessels
without aneurysm.

No inguinal hernia.  Small scattered inguinal lymph nodes, none
pathologically enlarged.

No acute bony abnormality degenerative disc disease changes at L4-
5.
IMPRESSION: No acute or significant abnormality within the pelvis.  No
explanation for the patient's right inguinal or vaginal pain.

## 2016-02-06 ENCOUNTER — Ambulatory Visit (INDEPENDENT_AMBULATORY_CARE_PROVIDER_SITE_OTHER): Payer: Medicare Other | Admitting: Sports Medicine

## 2016-02-06 ENCOUNTER — Encounter: Payer: Self-pay | Admitting: Sports Medicine

## 2016-02-06 DIAGNOSIS — M779 Enthesopathy, unspecified: Secondary | ICD-10-CM

## 2016-02-06 DIAGNOSIS — L84 Corns and callosities: Secondary | ICD-10-CM | POA: Diagnosis not present

## 2016-02-06 DIAGNOSIS — M21619 Bunion of unspecified foot: Secondary | ICD-10-CM

## 2016-02-06 DIAGNOSIS — M2040 Other hammer toe(s) (acquired), unspecified foot: Secondary | ICD-10-CM

## 2016-02-06 DIAGNOSIS — M7752 Other enthesopathy of left foot: Secondary | ICD-10-CM | POA: Diagnosis not present

## 2016-02-06 DIAGNOSIS — M79673 Pain in unspecified foot: Secondary | ICD-10-CM

## 2016-02-06 DIAGNOSIS — M778 Other enthesopathies, not elsewhere classified: Secondary | ICD-10-CM

## 2016-02-06 MED ORDER — TRIAMCINOLONE ACETONIDE 10 MG/ML IJ SUSP: 10.0000 mg | Freq: Once | INTRAMUSCULAR | Status: DC

## 2016-02-06 NOTE — Progress Notes (Signed)
Patient ID: Karina Chase, female   DOB: [birth year], 82 y.o.   MRN: [id number]  Subjective: Karina Chase is a 82 y.o. female patient who presents to office for evaluation of bilateral foot pain secondary to callus. Patient states that last trim of the callused areas helped a lot. She has been using toe spacers with much relief. Patient denies any other pedal complaints.    Patient Active Problem List   Diagnosis Date Noted  . Right groin pain 03/28/2013   Current Outpatient Prescriptions on File Prior to Visit  Medication Sig Dispense Refill  . amLODipine (NORVASC) 5 MG tablet Take 5 mg by mouth daily.    Marland Kitchen aspirin 325 MG tablet Take 325 mg by mouth daily.    . Clobetasol Prop Emollient Base 0.05 % emollient cream Apply topically 2 (two) times daily.    Marland Kitchen diltiazem (CARDIZEM CD) 120 MG 24 hr capsule Take 120 mg by mouth daily.    Marland Kitchen FLUZONE QUADRIVALENT injection See admin instructions.  0  . nitroGLYCERIN (NITROSTAT) 0.4 MG SL tablet Place 0.4 mg under the tongue every 5 (five) minutes as needed for chest pain.     No current facility-administered medications on file prior to visit.   Allergies  Allergen Reactions  . Levofloxacin Nausea And Vomiting  . Keflex [Cephalexin]   . Penicillins   . Prilosec [Omeprazole]    Objective:  General: Alert and oriented x3 in no acute distress  Dermatology: Keratotic lesions present on the right foot at 1st interspace at lateral aspect of hallux, plantar 2nd metatarsal and on the left at plantar 5th metatarsal head and medial 1st MTPJ/hallux with no signs of infection, no webspace macerations, no ecchymosis bilateral, all nails x 10 are short and well manicured.  Vascular: Dorsalis Pedis and Posterior Tibial pedal pulses1/4, Capillary Fill Time 4 seconds, Scant pedal hair growth bilateral, mild varicosities, no edema bilateral lower extremities, Temperature gradient within normal limits.  Neurology: Michaell Cowing sensation intact via light touch  bilateral.  Musculoskeletal: Significant bunion with crossover onto 2nd toe right>left, rigid hammertoes bilateral with distal fat pad migration, Mild tenderness with direct palpation at the keratotic lesions bilateral with left sub-met one lesion being most tender, Muscular strength 5/5 in all groups without pain or limitation on range of motion.   Assessment and Plan: Problem List Items Addressed This Visit    None    Visit Diagnoses    Foot pain, unspecified laterality    -  Primary    Corns and callus        Bunion        Hammertoe, unspecified laterality        Capsulitis of foot, left        Relevant Medications    triamcinolone acetonide (KENALOG) 10 MG/ML injection 10 mg      -Complete examination performed -Discussed etiology of corns and callus secondary to foot deformities and treatement options; risks, benefits, alternatives explained  -After oral consent and aseptic prep, injected a mixture containing 1 ml of 2%  plain lidocaine, 1 ml 0.5% plain marcaine, 0.5 ml of kenalog 10 and 0.5 ml of dexamethasone phosphate into left 1st MTPJ at area of callus without complication for symptomatic relief. Post-injection care discussed with patient.  -Parred keratoic lesions x4 using a chisel blade without incident  -Continue with toe tubing and silicone toe pad to use daily  -Recommend cont with good supportive shoes -Patient to return to office 10 weeks for callus care or  sooner if condition worsens. May consider custom accommodative inserts in future if continues to be bothersome.   Karina Chase, DPM

## 2016-05-06 ENCOUNTER — Other Ambulatory Visit: Payer: Self-pay | Admitting: Family Medicine

## 2016-05-06 ENCOUNTER — Ambulatory Visit
Admission: RE | Admit: 2016-05-06 | Discharge: 2016-05-06 | Disposition: A | Discharge status: Home / Self Care | Payer: Medicare Other | Source: Ambulatory Visit | Attending: Family Medicine | Admitting: Family Medicine

## 2016-05-06 DIAGNOSIS — R601 Generalized edema: Secondary | ICD-10-CM

## 2018-08-29 IMAGING — CR DG CHEST 2V
2 series · 2 of 2 positions shown · non-contrast
Comparison: Chest x-ray of 01/03/2007

CLINICAL DATA: Upper chest congestion for weeks, history of asthma

EXAM:
CHEST  2 VIEW

[w chest pa]
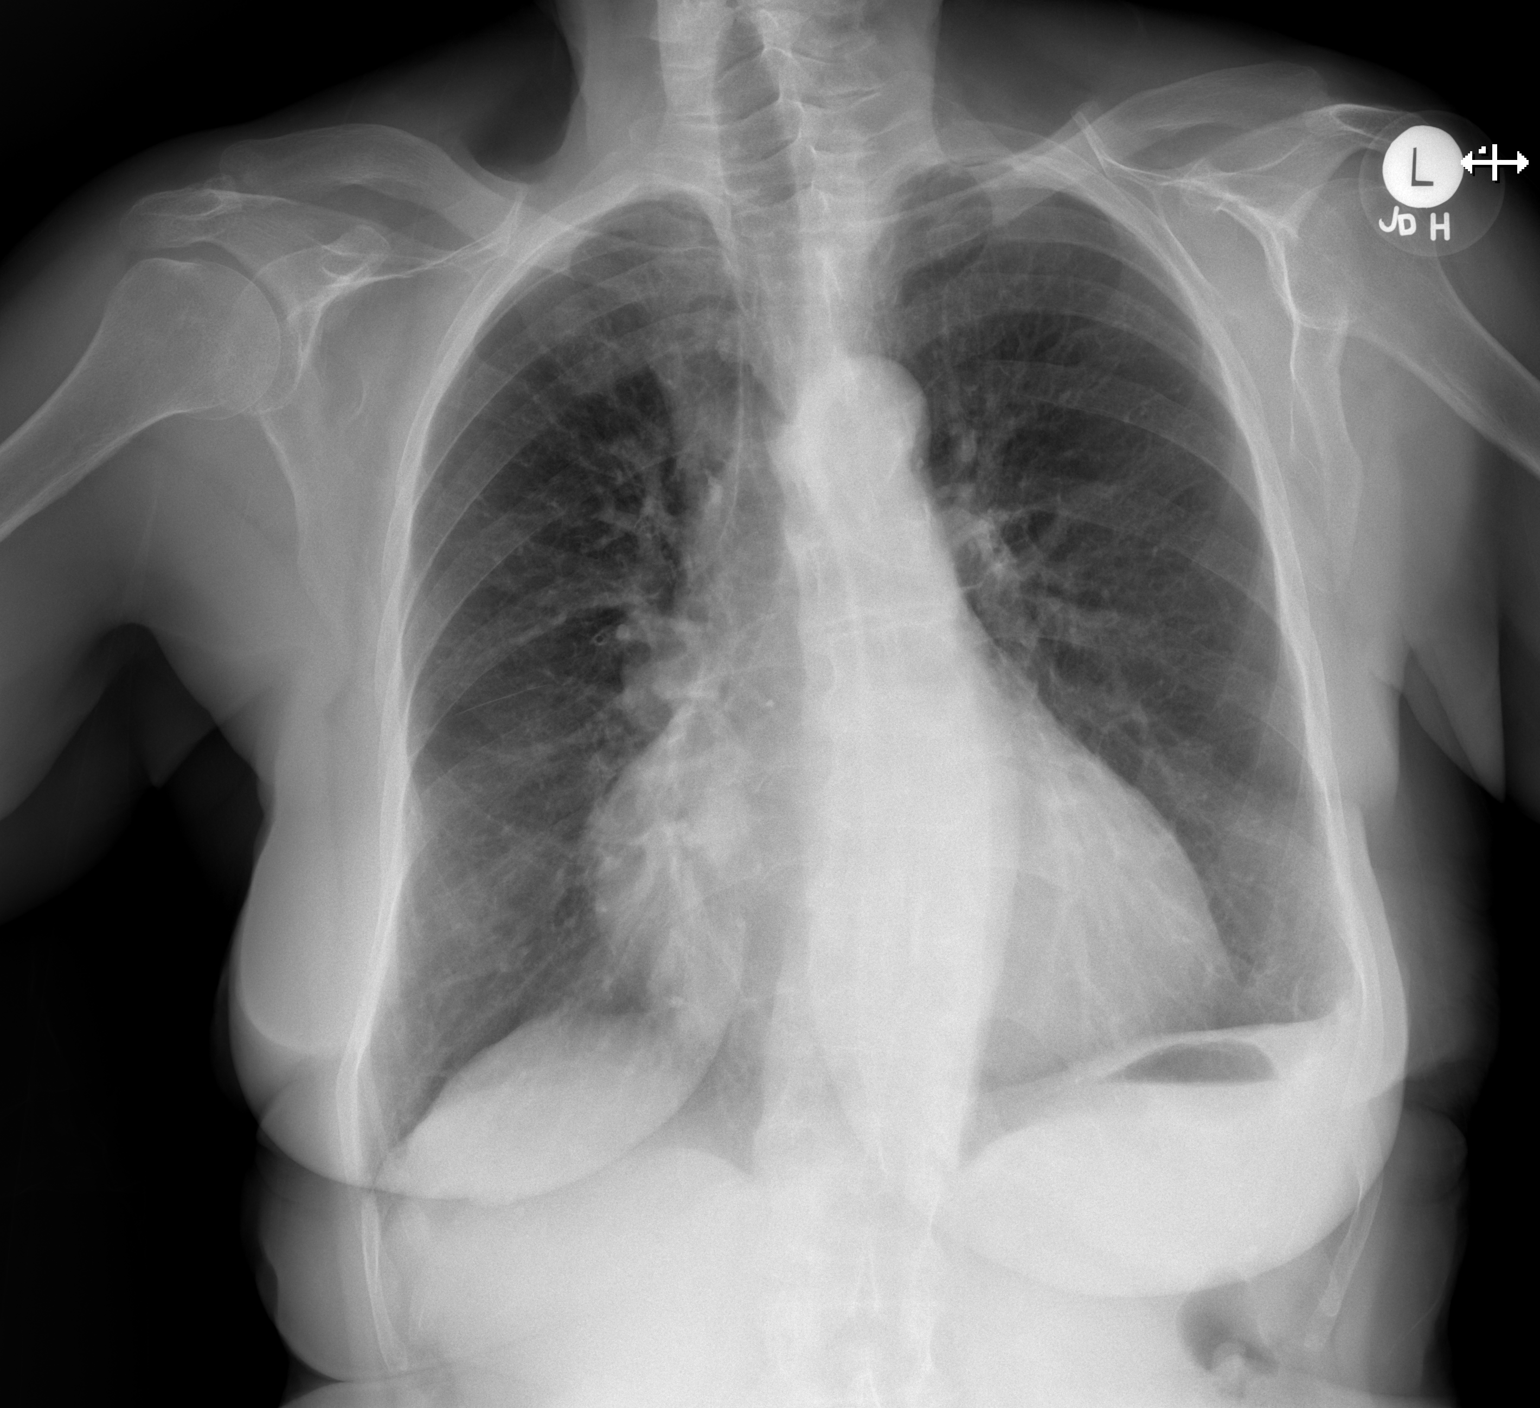

[w chest lat]
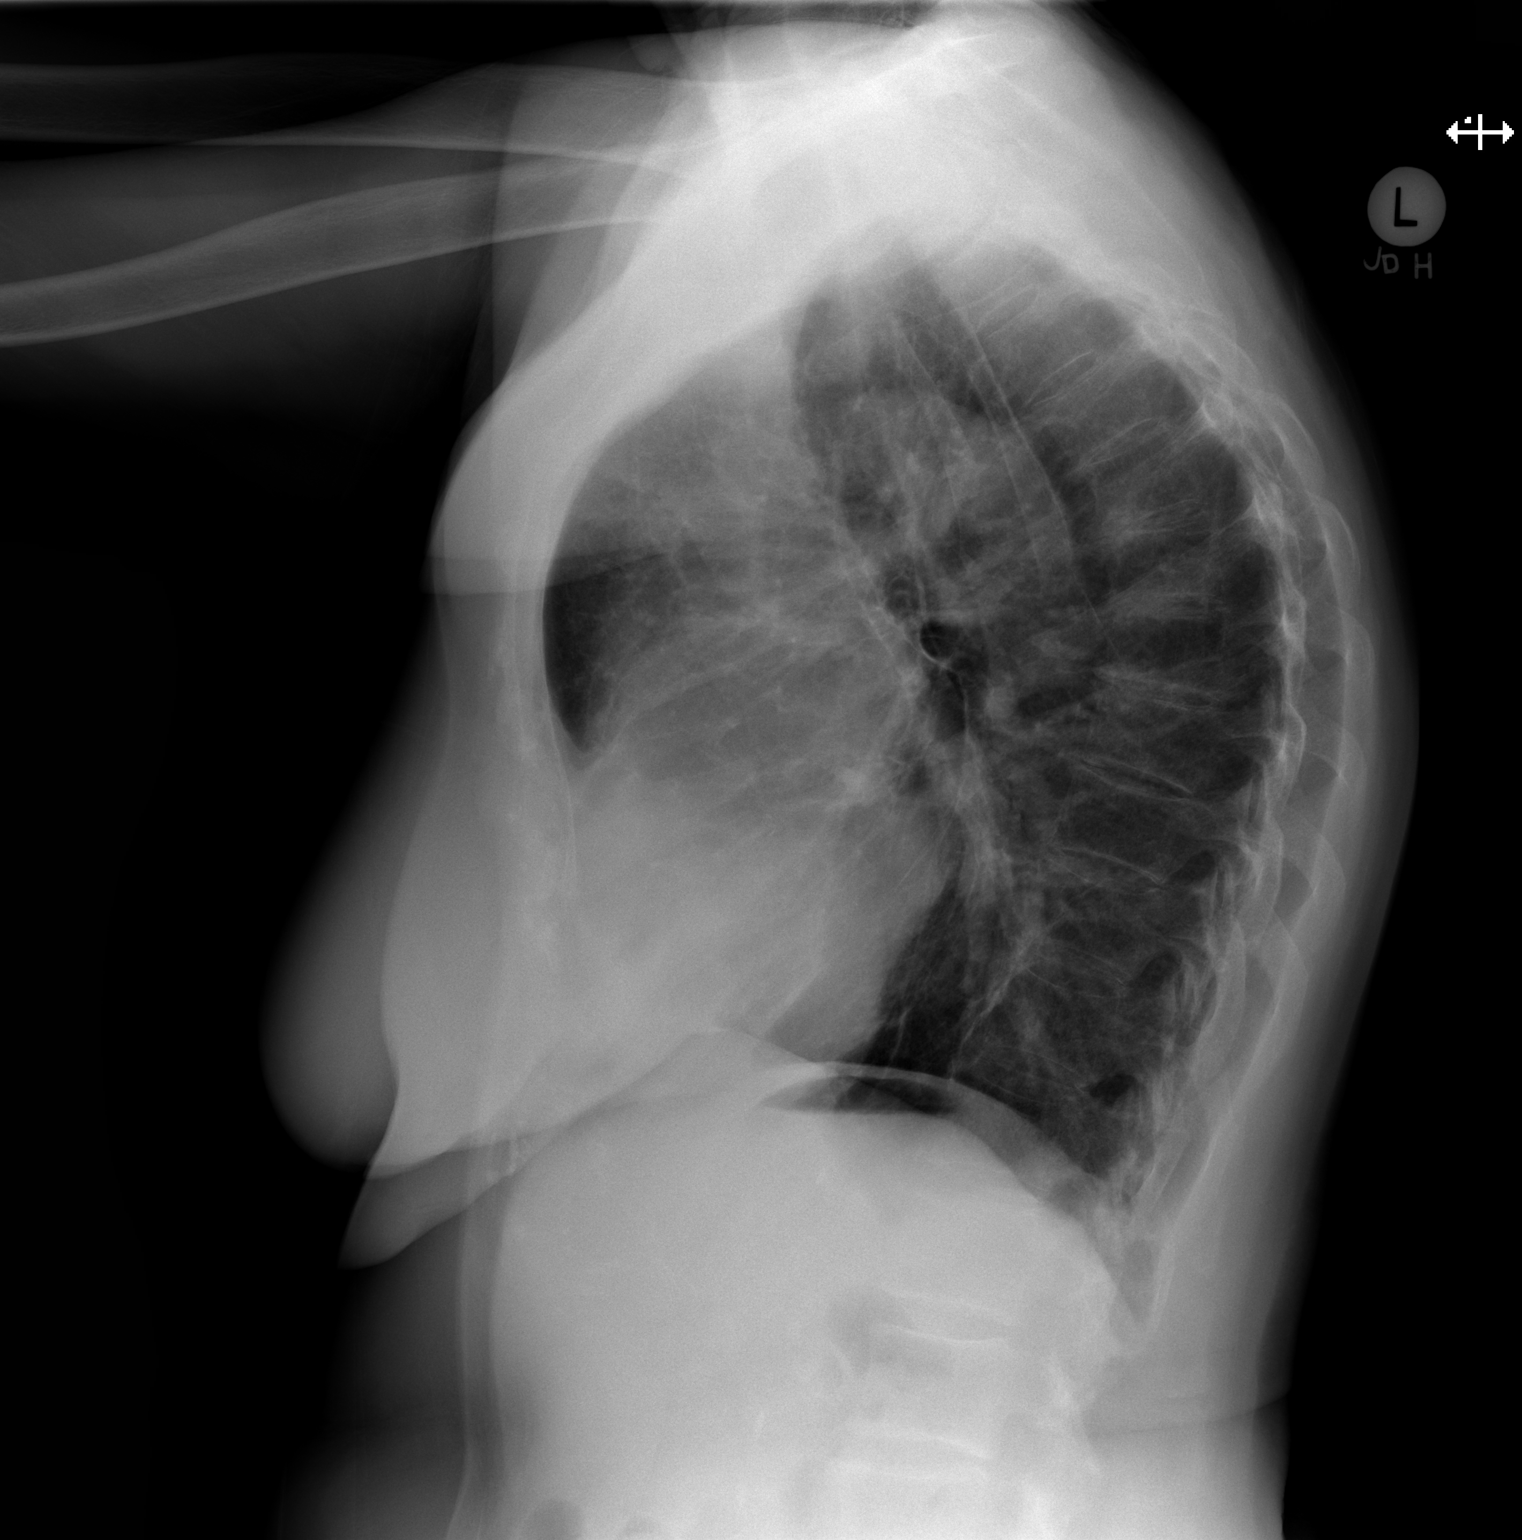

[2 of 2 positions shown; findings below may reference images not displayed]

FINDINGS: The lungs are clear and somewhat hyperaerated. Slight blunting of
the left costophrenic angle is stable and most consistent with
scarring. Mediastinal and hilar contours are unremarkable. Mild
cardiomegaly is stable. There is a mild thoracic curvature convex to
the left.
IMPRESSION: No change in hyper aeration.  No active lung disease.

## 2020-05-30 ENCOUNTER — Ambulatory Visit: Payer: Medicare Other | Admitting: Cardiology

## 2020-05-30 ENCOUNTER — Other Ambulatory Visit: Payer: Self-pay

## 2020-05-30 ENCOUNTER — Encounter: Payer: Self-pay | Admitting: Cardiology

## 2020-05-30 VITALS — BP 150/64 | HR 59 | Ht 63.0 in | Wt 138.0 lb

## 2020-05-30 DIAGNOSIS — I480 Paroxysmal atrial fibrillation: Secondary | ICD-10-CM

## 2020-05-30 DIAGNOSIS — I129 Hypertensive chronic kidney disease with stage 1 through stage 4 chronic kidney disease, or unspecified chronic kidney disease: Secondary | ICD-10-CM | POA: Diagnosis not present

## 2020-05-30 DIAGNOSIS — R55 Syncope and collapse: Secondary | ICD-10-CM

## 2020-05-30 DIAGNOSIS — N183 Chronic kidney disease, stage 3 unspecified: Secondary | ICD-10-CM

## 2020-05-30 DIAGNOSIS — I119 Hypertensive heart disease without heart failure: Secondary | ICD-10-CM | POA: Insufficient documentation

## 2020-05-30 HISTORY — DX: Hypertensive heart disease without heart failure: I11.9

## 2020-05-30 MED ORDER — APIXABAN 2.5 MG PO TABS: 2.5000 mg | ORAL_TABLET | Freq: Two times a day (BID) | ORAL | 1 refills | Status: DC

## 2020-05-30 NOTE — Patient Instructions (Signed)
Medication Instructions:  Your physician has recommended you make the following change in your medication:  STOP: Aspirin   START: Eliquis 2.5 mg twice daily   *If you need a refill on your cardiac medications before your next appointment, please call your pharmacy*   Lab Work: None.  If you have labs (blood work) drawn today and your tests are completely normal, you will receive your results only by: Marland Kitchen MyChart Message (if you have MyChart) OR . A paper copy in the mail If you have any lab test that is abnormal or we need to change your treatment, we will call you to review the results.   Testing/Procedures: Your physician has requested that you have an echocardiogram. Echocardiography is a painless test that uses sound waves to create images of your heart. It provides your doctor with information about the size and shape of your heart and how well your heart's chambers and valves are working. This procedure takes approximately one hour. There are no restrictions for this procedure.  A zio monitor was ordered today. It will remain on for 3 days. You will then return monitor and event diary in provided box. It takes 1-2 weeks for report to be downloaded and returned to [place]. We will call you with the results. If monitor falls off or has orange flashing light, please call Zio for further instructions.     Follow-Up: At [facility], you and your health needs are our priority.  As part of our continuing mission to provide you with exceptional heart care, we have created designated Provider Care Teams.  These Care Teams include your primary Cardiologist (physician) and Advanced Practice Providers (APPs -  Physician Assistants and Nurse Practitioners) who all work together to provide you with the care you need, when you need it.  We recommend signing up for the patient portal called "MyChart".  Sign up information is provided on this After Visit Summary.  MyChart is used to connect with  patients for Virtual Visits (Telemedicine).  Patients are able to view lab/test results, encounter notes, upcoming appointments, etc.  Non-urgent messages can be sent to your provider as well.   To learn more about what you can do with MyChart, go to [email or web address].    Your next appointment:   6 week(s)  The format for your next appointment:   In Person  Provider:   Shirlee More, MD   Other Instructions  Please get a bp machine with upper arm cuff.   Apixaban oral tablets What is this medicine? APIXABAN (a PIX a ban) is an anticoagulant (blood thinner). It is used to lower the chance of stroke in people with a medical condition called atrial fibrillation. It is also used to treat or prevent blood clots in the lungs or in the veins. This medicine may be used for other purposes; ask your health care provider or pharmacist if you have questions. COMMON BRAND NAME(S): Eliquis What should I tell my health care provider before I take this medicine? They need to know if you have any of these conditions:  antiphospholipid antibody syndrome  bleeding disorders  bleeding in the brain  blood in your stools (black or tarry stools) or if you have blood in your vomit  history of blood clots  history of stomach bleeding  kidney disease  liver disease  mechanical heart valve  an unusual or allergic reaction to apixaban, other medicines, foods, dyes, or preservatives  pregnant or trying to get pregnant  breast-feeding How should  I use this medicine? Take this medicine by mouth with a glass of water. Follow the directions on the prescription label. You can take it with or without food. If it upsets your stomach, take it with food. Take your medicine at regular intervals. Do not take it more often than directed. Do not stop taking except on your doctor's advice. Stopping this medicine may increase your risk of a blood clot. Be sure to refill your prescription before you run out  of medicine. Talk to your pediatrician regarding the use of this medicine in children. Special care may be needed. Overdosage: If you think you have taken too much of this medicine contact a poison control center or emergency room at once. NOTE: This medicine is only for you. Do not share this medicine with others. What if I miss a dose? If you miss a dose, take it as soon as you can. If it is almost time for your next dose, take only that dose. Do not take double or extra doses. What may interact with this medicine? This medicine may interact with the following:  aspirin and aspirin-like medicines  certain medicines for fungal infections like ketoconazole and itraconazole  certain medicines for seizures like carbamazepine and phenytoin  certain medicines that treat or prevent blood clots like warfarin, enoxaparin, and dalteparin  clarithromycin  NSAIDs, medicines for pain and inflammation, like ibuprofen or naproxen  rifampin  ritonavir  St. John's wort This list may not describe all possible interactions. Give your health care provider a list of all the medicines, herbs, non-prescription drugs, or dietary supplements you use. Also tell them if you smoke, drink alcohol, or use illegal drugs. Some items may interact with your medicine. What should I watch for while using this medicine? Visit your healthcare professional for regular checks on your progress. You may need blood work done while you are taking this medicine. Your condition will be monitored carefully while you are receiving this medicine. It is important not to miss any appointments. Avoid sports and activities that might cause injury while you are using this medicine. Severe falls or injuries can cause unseen bleeding. Be careful when using sharp tools or knives. Consider using an Copy. Take special care brushing or flossing your teeth. Report any injuries, bruising, or red spots on the skin to your healthcare  professional. If you are going to need surgery or other procedure, tell your healthcare professional that you are taking this medicine. Wear a medical ID bracelet or chain. Carry a card that describes your disease and details of your medicine and dosage times. What side effects may I notice from receiving this medicine? Side effects that you should report to your doctor or health care professional as soon as possible:  allergic reactions like skin rash, itching or hives, swelling of the face, lips, or tongue  signs and symptoms of bleeding such as bloody or black, tarry stools; red or dark-brown urine; spitting up blood or brown material that looks like coffee grounds; red spots on the skin; unusual bruising or bleeding from the eye, gums, or nose  signs and symptoms of a blood clot such as chest pain; shortness of breath; pain, swelling, or warmth in the leg  signs and symptoms of a stroke such as changes in vision; confusion; trouble speaking or understanding; severe headaches; sudden numbness or weakness of the face, arm or leg; trouble walking; dizziness; loss of coordination This list may not describe all possible side effects. Call your doctor  for medical advice about side effects. You may report side effects to FDA at 1-800-FDA-1088. Where should I keep my medicine? Keep out of the reach of children. Store at room temperature between 20 and 25 degrees C (68 and 77 degrees F). Throw away any unused medicine after the expiration date. NOTE: This sheet is a summary. It may not cover all possible information. If you have questions about this medicine, talk to your doctor, pharmacist, or health care provider.  2020 Elsevier/Gold Standard (2018-05-04 17:39:34)   Echocardiogram An echocardiogram is a procedure that uses painless sound waves (ultrasound) to produce an image of the heart. Images from an echocardiogram can provide important information about:  Signs of coronary artery disease  (CAD).  Aneurysm detection. An aneurysm is a weak or damaged part of an artery wall that bulges out from the normal force of blood pumping through the body.  Heart size and shape. Changes in the size or shape of the heart can be associated with certain conditions, including heart failure, aneurysm, and CAD.  Heart muscle function.  Heart valve function.  Signs of a past heart attack.  Fluid buildup around the heart.  Thickening of the heart muscle.  A tumor or infectious growth around the heart valves. Tell a health care provider about:  Any allergies you have.  All medicines you are taking, including vitamins, herbs, eye drops, creams, and over-the-counter medicines.  Any blood disorders you have.  Any surgeries you have had.  Any medical conditions you have.  Whether you are pregnant or may be pregnant. What are the risks? Generally, this is a safe procedure. However, problems may occur, including:  Allergic reaction to dye (contrast) that may be used during the procedure. What happens before the procedure? No specific preparation is needed. You may eat and drink normally. What happens during the procedure?   An IV tube may be inserted into one of your veins.  You may receive contrast through this tube. A contrast is an injection that improves the quality of the pictures from your heart.  A gel will be applied to your chest.  A wand-like tool (transducer) will be moved over your chest. The gel will help to transmit the sound waves from the transducer.  The sound waves will harmlessly bounce off of your heart to allow the heart images to be captured in real-time motion. The images will be recorded on a computer. The procedure may vary among health care providers and hospitals. What happens after the procedure?  You may return to your normal, everyday life, including diet, activities, and medicines, unless your health care provider tells you not to do  that. Summary  An echocardiogram is a procedure that uses painless sound waves (ultrasound) to produce an image of the heart.  Images from an echocardiogram can provide important information about the size and shape of your heart, heart muscle function, heart valve function, and fluid buildup around your heart.  You do not need to do anything to prepare before this procedure. You may eat and drink normally.  After the echocardiogram is completed, you may return to your normal, everyday life, unless your health care provider tells you not to do that. This information is not intended to replace advice given to you by your health care provider. Make sure you discuss any questions you have with your health care provider. Document Revised: 12/15/2018 Document Reviewed: 09/26/2016 Elsevier Patient Education  [place].

## 2020-05-30 NOTE — Progress Notes (Signed)
Cardiology Office Note:    Date:  05/30/2020   ID:  Karina Chase, DOB [birth year], MRN [id number]  PCP:  Shirline Frees, MD  Cardiologist:  Shirlee More, MD   Referring MD: Shirline Frees, MD  ASSESSMENT:    1. Syncope and collapse   2. Paroxysmal atrial fibrillation (HCC)   3. Hypertensive heart disease without heart failure   4. Benign hypertension with CKD (chronic kidney disease) stage III    PLAN:    In order of problems listed above:  1. Her episode of syncope with atrial fibrillation implies sinus node dysfunction or atrial fibrillation is now persistent for 2 weeks and for further evaluation 3-day ZIO monitor echocardiogram follow home blood pressures with a new upper arm digital cuff and reassess back in the office in 6 weeks.  For stroke prophylaxis she will transition aspirin to low-dose Eliquis with age and renal function and in her case we will set of blood pressure 546 systolic as goal with age greater than 81.  Next appointment 6 weeks   Medication Adjustments/Labs and Tests Ordered: Current medicines are reviewed at length with the patient today.  Concerns regarding medicines are outlined above.  No orders of the defined types were placed in this encounter.  No orders of the defined types were placed in this encounter.    Chief Complaint  Patient presents with  . Atrial Fibrillation  . Loss of Consciousness    History of Present Illness:    Karina Chase is a 86 y.o. female who is being seen today for the evaluation of syncope at the request of Shirline Frees, MD. She had an episode 05/18/2020 attended to by EMS and they found her to be in atrial fibrillation.  Medical problems include hypertension stage III CKD strep breast cancer 1998 surgery and radiation therapy diabetes and a background history of paroxysmal atrial fibrillation.  Her daughter is present participates in evaluation medical decision-making.  She remains a very vigorous and  active woman.  She has had atrial fibrillation in the past but it was not persistent.  Since the episode of fainting on the 12th she has not felt well she is a little lightheaded feels a little bit weak and she continues in atrial fibrillation today home heart rates run in the high 50s to 80 at home she is checking blood pressure which is over 150 but uses a wrist device.  Today her blood pressure is in range for person in the 90s.  No chest pain or shortness of breath no palpitation.  She is thought about anticoagulation we reviewed the risk benefits and options will discontinue aspirin will place her on low-dose anticoagulant with age renal function applied 3-day ZIO monitor to assess heart rate and looking for bradycardia and see back in the office for decision-making whether she remains in atrial fibrillation suppressant treatment whether it is paroxysmal or whether he should consider resuming sinus rhythm.  Past Medical History:  Diagnosis Date  . Arthritis   . Asthma   . Atrial fibrillation (HCC)    intermittent  . Breast cancer ([place])   . Cancer ([place])    Basal cell skin cancer  . Colon polyp   . GERD (gastroesophageal reflux disease)   . History of shingles   . Hypertension   . Stress incontinence   . Varicose veins   . Vertigo     Past Surgical History:  Procedure Laterality Date  . ABDOMINAL HYSTERECTOMY  1972   BSO  .  BREAST SURGERY     lumpectomy  . SKIN CANCER EXCISION  2014    Current Medications: Current Meds  Medication Sig  . amLODipine (NORVASC) 5 MG tablet Take 5 mg by mouth daily.  Marland Kitchen diltiazem (CARDIZEM CD) 120 MG 24 hr capsule Take 120 mg by mouth daily.  . hydrochlorothiazide (MICROZIDE) 12.5 MG capsule Take 12.5 mg by mouth as needed.  . nitroGLYCERIN (NITROSTAT) 0.4 MG SL tablet Place 0.4 mg under the tongue every 5 (five) minutes as needed for chest pain.  . [DISCONTINUED] aspirin EC 81 MG tablet Take 243 mg by mouth daily. Swallow whole.  . [DISCONTINUED]  Clobetasol Prop Emollient Base 0.05 % emollient cream Apply topically 2 (two) times daily.   Current Facility-Administered Medications for the 05/30/20 encounter (Office Visit) with Richardo Priest, MD  Medication  . triamcinolone acetonide (KENALOG) 10 MG/ML injection 10 mg     Allergies:   Levofloxacin, Keflex [cephalexin], Prilosec [omeprazole], Sulfa antibiotics, Azithromycin, and Penicillins   Social History   Socioeconomic History  . Marital status: Married    Spouse name: Not on file  . Number of children: Not on file  . Years of education: Not on file  . Highest education level: Not on file  Occupational History  . Not on file  Tobacco Use  . Smoking status: Never Smoker  . Smokeless tobacco: Never Used  Substance and Sexual Activity  . Alcohol use: No  . Drug use: No  . Sexual activity: Not Currently    Birth control/protection: Abstinence  Other Topics Concern  . Not on file  Social History Narrative  . Not on file   Social Determinants of Health   Financial Resource Strain:   . Difficulty of Paying Living Expenses: Not on file  Food Insecurity:   . Worried About Charity fundraiser in the Last Year: Not on file  . Ran Out of Food in the Last Year: Not on file  Transportation Needs:   . Lack of Transportation (Medical): Not on file  . Lack of Transportation (Non-Medical): Not on file  Physical Activity:   . Days of Exercise per Week: Not on file  . Minutes of Exercise per Session: Not on file  Stress:   . Feeling of Stress : Not on file  Social Connections:   . Frequency of Communication with Friends and Family: Not on file  . Frequency of Social Gatherings with Friends and Family: Not on file  . Attends Religious Services: Not on file  . Active Member of Clubs or Organizations: Not on file  . Attends Archivist Meetings: Not on file  . Marital Status: Not on file     Family History: The patient's family history includes Cancer in her  maternal grandmother; Heart disease in her father; Stroke in her mother.  ROS:   ROS Please see the history of present illness.     All other systems reviewed and are negative.  EKGs/Labs/Other Studies Reviewed:    The following studies were reviewed today:   EKG:  EKG is  ordered today.  The ekg ordered today is personally reviewed and demonstrates rate controlled atrial fibrillation  Recent Labs: No results found for requested labs within last 8760 hours.  Recent Lipid Panel No results found for: CHOL, TRIG, HDL, CHOLHDL, VLDL, LDLCALC, LDLDIRECT  Physical Exam:    VS:  BP (!) 150/64 (BP Location: Left Arm, Patient Position: Sitting, Cuff Size: Normal)   Pulse (!) 59  Ht 5\' 3"  (1.6 m)   Wt 138 lb (62.6 kg)   SpO2 96%   BMI 24.45 kg/m     Wt Readings from Last 3 Encounters:  05/30/20 138 lb (62.6 kg)  03/28/13 162 lb 6.4 oz (73.7 kg)     GEN:  Well nourished, well developed in no acute distress HEENT: Normal NECK: No JVD; No carotid bruits LYMPHATICS: No lymphadenopathy CARDIAC: Irregular S1 variable RRR, no murmurs, rubs, gallops RESPIRATORY:  Clear to auscultation without rales, wheezing or rhonchi  ABDOMEN: Soft, non-tender, non-distended MUSCULOSKELETAL:  No edema; No deformity  SKIN: Warm and dry NEUROLOGIC:  Alert and oriented x 3 PSYCHIATRIC:  Normal affect     Signed, Shirlee More, MD  05/30/2020 11:28 AM    [facility]

## 2020-06-08 ENCOUNTER — Ambulatory Visit (INDEPENDENT_AMBULATORY_CARE_PROVIDER_SITE_OTHER): Payer: Medicare Other

## 2020-06-08 DIAGNOSIS — N183 Chronic kidney disease, stage 3 unspecified: Secondary | ICD-10-CM

## 2020-06-08 DIAGNOSIS — R55 Syncope and collapse: Secondary | ICD-10-CM

## 2020-06-08 DIAGNOSIS — I129 Hypertensive chronic kidney disease with stage 1 through stage 4 chronic kidney disease, or unspecified chronic kidney disease: Secondary | ICD-10-CM

## 2020-06-08 DIAGNOSIS — I480 Paroxysmal atrial fibrillation: Secondary | ICD-10-CM

## 2020-06-08 DIAGNOSIS — I119 Hypertensive heart disease without heart failure: Secondary | ICD-10-CM

## 2020-06-20 ENCOUNTER — Ambulatory Visit (INDEPENDENT_AMBULATORY_CARE_PROVIDER_SITE_OTHER): Payer: Medicare Other

## 2020-06-20 ENCOUNTER — Other Ambulatory Visit: Payer: Self-pay

## 2020-06-20 DIAGNOSIS — I129 Hypertensive chronic kidney disease with stage 1 through stage 4 chronic kidney disease, or unspecified chronic kidney disease: Secondary | ICD-10-CM

## 2020-06-20 DIAGNOSIS — I119 Hypertensive heart disease without heart failure: Secondary | ICD-10-CM

## 2020-06-20 DIAGNOSIS — I480 Paroxysmal atrial fibrillation: Secondary | ICD-10-CM | POA: Diagnosis not present

## 2020-06-20 DIAGNOSIS — N183 Chronic kidney disease, stage 3 unspecified: Secondary | ICD-10-CM

## 2020-06-20 DIAGNOSIS — R55 Syncope and collapse: Secondary | ICD-10-CM | POA: Diagnosis not present

## 2020-06-20 LAB — ECHOCARDIOGRAM COMPLETE
Area-P 1/2: 4.21 cm2
S' Lateral: 2.5 cm

## 2020-06-20 NOTE — Progress Notes (Signed)
Complete echocardiogram performed.  Jimmy Jacob Cicero RDCS, RVT  

## 2020-06-21 ENCOUNTER — Telehealth: Payer: Self-pay

## 2020-06-21 NOTE — Telephone Encounter (Signed)
-----   Message from Richardo Priest, MD sent at 06/21/2020  7:56 AM EDT ----- In general good results  I will review at the office follow-up

## 2020-06-21 NOTE — Telephone Encounter (Signed)
Spoke with patient regarding results and recommendation.  Patient verbalizes understanding and is agreeable to plan of care. Advised patient to call back with any issues or concerns.  

## 2020-07-15 DIAGNOSIS — J45909 Unspecified asthma, uncomplicated: Secondary | ICD-10-CM | POA: Insufficient documentation

## 2020-07-15 DIAGNOSIS — N393 Stress incontinence (female) (male): Secondary | ICD-10-CM | POA: Insufficient documentation

## 2020-07-15 DIAGNOSIS — C50919 Malignant neoplasm of unspecified site of unspecified female breast: Secondary | ICD-10-CM | POA: Insufficient documentation

## 2020-07-15 DIAGNOSIS — R42 Dizziness and giddiness: Secondary | ICD-10-CM | POA: Insufficient documentation

## 2020-07-15 DIAGNOSIS — K219 Gastro-esophageal reflux disease without esophagitis: Secondary | ICD-10-CM | POA: Insufficient documentation

## 2020-07-15 DIAGNOSIS — Z8619 Personal history of other infectious and parasitic diseases: Secondary | ICD-10-CM | POA: Insufficient documentation

## 2020-07-15 DIAGNOSIS — I4819 Other persistent atrial fibrillation: Secondary | ICD-10-CM | POA: Insufficient documentation

## 2020-07-15 DIAGNOSIS — I4891 Unspecified atrial fibrillation: Secondary | ICD-10-CM | POA: Insufficient documentation

## 2020-07-15 DIAGNOSIS — I10 Essential (primary) hypertension: Secondary | ICD-10-CM | POA: Insufficient documentation

## 2020-07-15 DIAGNOSIS — K635 Polyp of colon: Secondary | ICD-10-CM | POA: Insufficient documentation

## 2020-07-15 DIAGNOSIS — M1990 Unspecified osteoarthritis, unspecified site: Secondary | ICD-10-CM | POA: Insufficient documentation

## 2020-07-15 DIAGNOSIS — C801 Malignant (primary) neoplasm, unspecified: Secondary | ICD-10-CM | POA: Insufficient documentation

## 2020-07-15 NOTE — Progress Notes (Signed)
Cardiology Office Note:    Date:  07/16/2020   ID:  Karina Chase, DOB 09-11-1924, MRN [id number]  PCP:  Shirline Frees, MD  Cardiologist:  Shirlee More, MD    Referring MD: Shirline Frees, MD    ASSESSMENT:    1. Persistent atrial fibrillation ([place])   2. Chronic anticoagulation   3. Hypertensive heart disease without heart failure   4. Syncope and collapse    PLAN:    In order of problems listed above:  1. She is in atrial fibrillation persistent rate controlled I would continue her calcium channel blocker direct anticoagulant at this time would not advise attempts at cardioversion and resuming sinus rhythm.  2. Continue anticoagulant no indication of a bleeding complication.  I asked her family if her flank pain was worsened to contact me or her PCP and I would either do a CT or an ultrasound of the flank.  She has no history of stone and is not having urinary symptoms or hematuria 3. BP at home has been in range continue current treatment amlodipine diltiazem and thiazide diuretic 4. Fortunately no recurrence, I think she would be at risk for bradycardia with attempts to resume sinus rhythm and I would strongly advised to continue current treatment with rate controlled atrial fibrillation is asymptomatic.  I asked her daughter to purchase the smart phone adapter to screen her heart rhythm at home any concerns of stent strips to [place] through my chart   Next appointment: 6 months at the request of the family   Medication Adjustments/Labs and Tests Ordered: Current medicines are reviewed at length with the patient today.  Concerns regarding medicines are outlined above.  No orders of the defined types were placed in this encounter.  No orders of the defined types were placed in this encounter.   Chief Complaint  Patient presents with   Follow-up   Atrial Fibrillation   Anticoagulation    History of Present Illness:    Karina Chase is a 86 y.o. female with a hx  of syncope, paroxysmal atrial fibrillation stage III CKD and a history of breast cancer 1998 with surgery and radiation therapy.  Months last seen 05/30/2020 and persistent atrial fibrillation and initiated on anticoagulation.  She utilized a ZIO monitor for 4 days showing her to be in rate controlled atrial fibrillation 50 to 110 bpm 90% of daytime and nighttime. Compliance with diet, lifestyle and medications: Yes  Her daughter is present son is on conference phone.  They participate in evaluation decision making. Overall she is done well they are following heart rate and blood pressure at home and she has had no palpitation or syncope.  She tolerates her anticoagulant without bleeding complication.  Home blood pressure typically runs in the 130-140/70 range.  Recently she has had some right flank pain no urinary symptoms no bleeding.  It is point localized on the lower rib margin no trauma. She had recent labs performed with her PCP 07/03/2019 on the creatinine 1.10 BUN 24. Past Medical History:  Diagnosis Date   Arthritis    Asthma    Atrial fibrillation (HCC)    intermittent   Breast cancer ([place])    Cancer (HCC)    Basal cell skin cancer   Colon polyp    GERD (gastroesophageal reflux disease)    History of shingles    Hypertension    Hypertensive heart disease 05/30/2020   Right groin pain 03/28/2013   Stress incontinence    Varicose veins  Vertigo     Past Surgical History:  Procedure Laterality Date   ABDOMINAL HYSTERECTOMY  1972   BSO   BREAST SURGERY     lumpectomy   SKIN CANCER EXCISION  2014    Current Medications: Current Meds  Medication Sig   amLODipine (NORVASC) 5 MG tablet Take 5 mg by mouth daily.   apixaban (ELIQUIS) 2.5 MG TABS tablet Take 1 tablet (2.5 mg total) by mouth 2 (two) times daily.   diltiazem (CARDIZEM CD) 120 MG 24 hr capsule Take 120 mg by mouth daily.   hydrochlorothiazide (MICROZIDE) 12.5 MG capsule Take 12.5 mg by  mouth as needed.   nitroGLYCERIN (NITROSTAT) 0.4 MG SL tablet Place 0.4 mg under the tongue every 5 (five) minutes as needed for chest pain.   Current Facility-Administered Medications for the 07/16/20 encounter (Office Visit) with Richardo Priest, MD  Medication   triamcinolone acetonide (KENALOG) 10 MG/ML injection 10 mg     Allergies:   Levofloxacin, Keflex [cephalexin], Prilosec [omeprazole], Sulfa antibiotics, Azithromycin, and Penicillins   Social History   Socioeconomic History   Marital status: Married    Spouse name: Not on file   Number of children: Not on file   Years of education: Not on file   Highest education level: Not on file  Occupational History   Not on file  Tobacco Use   Smoking status: Never Smoker   Smokeless tobacco: Never Used  Substance and Sexual Activity   Alcohol use: No   Drug use: No   Sexual activity: Not Currently    Birth control/protection: Abstinence  Other Topics Concern   Not on file  Social History Narrative   Not on file   Social Determinants of Health   Financial Resource Strain:    Difficulty of Paying Living Expenses: Not on file  Food Insecurity:    Worried About [place] in the Last Year: Not on file   Ran Out of Food in the Last Year: Not on file  Transportation Needs:    Lack of Transportation (Medical): Not on file   Lack of Transportation (Non-Medical): Not on file  Physical Activity:    Days of Exercise per Week: Not on file   Minutes of Exercise per Session: Not on file  Stress:    Feeling of Stress : Not on file  Social Connections:    Frequency of Communication with Friends and Family: Not on file   Frequency of Social Gatherings with Friends and Family: Not on file   Attends Religious Services: Not on file   Active Member of Clubs or Organizations: Not on file   Attends Archivist Meetings: Not on file   Marital Status: Not on file     Family History: The  patient's family history includes Cancer in her maternal grandmother; Heart disease in her father; Stroke in her mother. ROS:   Please see the history of present illness.    All other systems reviewed and are negative.  EKGs/Labs/Other Studies Reviewed:    The following studies were reviewed today:  EKG:  EKG ordered today and personally reviewed.  The ekg ordered today demonstrates rate controlled atrial fibrillation  Recent Labs: No results found for requested labs within last 8760 hours.  Recent Lipid Panel No results found for: CHOL, TRIG, HDL, CHOLHDL, VLDL, LDLCALC, LDLDIRECT  Physical Exam:    VS:  BP (!) 167/81    Pulse 70    Ht 5\' 3"  (1.6 m)  Wt 140 lb (63.5 kg)    SpO2 96%    BMI 24.80 kg/m     Wt Readings from Last 3 Encounters:  07/16/20 140 lb (63.5 kg)  05/30/20 138 lb (62.6 kg)  03/28/13 162 lb 6.4 oz (73.7 kg)     GEN: Appears her age well nourished, well developed in no acute distress HEENT: Normal NECK: No JVD; No carotid bruits LYMPHATICS: No lymphadenopathy CARDIAC: Atrial rhythm is obvious fibrillation irregular S1 variable RRR, no murmurs, rubs, gallops RESPIRATORY:  Clear to auscultation without rales, wheezing or rhonchi  ABDOMEN: Soft, non-tender, non-distended she has no flank tenderness MUSCULOSKELETAL:  No edema; No deformity  SKIN: Warm and dry NEUROLOGIC:  Alert and oriented x 3 PSYCHIATRIC:  Normal affect    Signed, Shirlee More, MD  07/16/2020 11:52 AM    [place]

## 2020-07-16 ENCOUNTER — Encounter: Payer: Self-pay | Admitting: Cardiology

## 2020-07-16 ENCOUNTER — Other Ambulatory Visit: Payer: Self-pay

## 2020-07-16 ENCOUNTER — Ambulatory Visit: Payer: Medicare Other | Admitting: Cardiology

## 2020-07-16 VITALS — BP 167/81 | HR 70 | Ht 63.0 in | Wt 140.0 lb

## 2020-07-16 DIAGNOSIS — I119 Hypertensive heart disease without heart failure: Secondary | ICD-10-CM

## 2020-07-16 DIAGNOSIS — R55 Syncope and collapse: Secondary | ICD-10-CM

## 2020-07-16 DIAGNOSIS — I4819 Other persistent atrial fibrillation: Secondary | ICD-10-CM | POA: Diagnosis not present

## 2020-07-16 DIAGNOSIS — Z7901 Long term (current) use of anticoagulants: Secondary | ICD-10-CM | POA: Diagnosis not present

## 2020-07-16 NOTE — Addendum Note (Signed)
Addended by: Resa Miner I on: 07/16/2020 01:39 PM   Modules accepted: Orders

## 2020-07-16 NOTE — Patient Instructions (Addendum)
Medication Instructions:  Your physician recommends that you continue on your current medications as directed. Please refer to the Current Medication list given to you today.  *If you need a refill on your cardiac medications before your next appointment, please call your pharmacy*   Lab Work: None If you have labs (blood work) drawn today and your tests are completely normal, you will receive your results only by: . MyChart Message (if you have MyChart) OR . A paper copy in the mail If you have any lab test that is abnormal or we need to change your treatment, we will call you to review the results.   Testing/Procedures: None   Follow-Up: At [facility], you and your health needs are our priority.  As part of our continuing mission to provide you with exceptional heart care, we have created designated Provider Care Teams.  These Care Teams include your primary Cardiologist (physician) and Advanced Practice Providers (APPs -  Physician Assistants and Nurse Practitioners) who all work together to provide you with the care you need, when you need it.  We recommend signing up for the patient portal called "MyChart".  Sign up information is provided on this After Visit Summary.  MyChart is used to connect with patients for Virtual Visits (Telemedicine).  Patients are able to view lab/test results, encounter notes, upcoming appointments, etc.  Non-urgent messages can be sent to your provider as well.   To learn more about what you can do with MyChart, go to [email or web address].    Your next appointment:   6 month(s)  The format for your next appointment:   In Person  Provider:   Brian Munley, MD   Other Instructions  KardiaMobile [email or web address]        FDA-cleared, clinical grade mobile EKG monitor: Kardia is the most clinically-validated mobile EKG used by the world's leading cardiac care medical professionals With Basic service, know  instantly if your heart rhythm is normal or if atrial fibrillation is detected, and email the last single EKG recording to yourself or your doctor Premium service, available for purchase through the Kardia app for $9.99 per month or $99 per year, includes unlimited history and storage of your EKG recordings, a monthly EKG summary report to share with your doctor, along with the ability to track your blood pressure, activity and weight Includes one KardiaMobile phone clip FREE SHIPPING: Standard delivery 1-3 business days. Orders placed by 11:00am PST will ship that afternoon. Otherwise, will ship next business day. All orders ship via USPS Priority Mail from [place], [place]     

## 2020-09-03 ENCOUNTER — Telehealth: Payer: Self-pay | Admitting: Cardiology

## 2020-09-03 MED ORDER — APIXABAN 2.5 MG PO TABS: 2.5000 mg | ORAL_TABLET | Freq: Two times a day (BID) | ORAL | 3 refills | Status: DC

## 2020-09-03 NOTE — Telephone Encounter (Signed)
*  STAT* If patient is at the pharmacy, call can be transferred to refill team.   1. Which medications need to be refilled? (please list name of each medication and dose if known)  New prescription forEliquis   2. Which pharmacy/location (including street and city if local pharmacy) is medication to be sent to? [address] [facility], [place]  3. Do they need a 30 day or 90 day supply? 90 days and refills

## 2020-10-09 ENCOUNTER — Telehealth: Payer: Self-pay | Admitting: Cardiology

## 2020-10-09 MED ORDER — FUROSEMIDE 20 MG PO TABS: 20.0000 mg | ORAL_TABLET | Freq: Every day | ORAL | 3 refills | Status: DC

## 2020-10-09 NOTE — Telephone Encounter (Signed)
Great job with triage information  I do not think this is related to Eliquis  I think she has an element of heart failure related to her atrial fibrillation  I would stop hydrochlorothiazide I would start furosemide 20 mg daily and we get her in the office Friday.

## 2020-10-09 NOTE — Telephone Encounter (Signed)
Spoke to the patients daughter just now and let her know Dr. Joya Gaskins recommendations. She is scheduled for Tuesday as this was the soonest that we are back in [place] and the daughter did not want to drive to [facility]. Dr. Bettina Gavia states that this is fine with him as well. She verbalizes understanding and thanks me for the call back.

## 2020-10-09 NOTE — Telephone Encounter (Signed)
Pt c/o medication issue:  1. Name of Medication:apixaban (ELIQUIS) 2.5 MG TABS tablet  2. How are you currently taking this medication (dosage and times per day)? As directed  3. Are you having a reaction (difficulty breathing--STAT)? yes  4. What is your medication issue? Per daughter, Levander Campion, patient is having hot flashes, a rash on her forehead, joints are swollen and painful and she is having some swelling in her ankles and calves. Levander Campion states that the only thing different the patient has done is switch from baby aspirin to Eliquis. She is propping her feet up during the day and does not have have any SOB associated with the swelling. Please advise.

## 2020-10-14 NOTE — Progress Notes (Signed)
Cardiology Office Note:    Date:  10/15/2020   ID:  Karina Chase, DOB 12-07-1924, MRN [id number]  PCP:  Shirline Frees, MD  Cardiologist:  Shirlee More, MD    Referring MD: Shirline Frees, MD    ASSESSMENT:    1. Persistent atrial fibrillation ([place])   2. Chronic anticoagulation   3. Hypertensive heart disease without heart failure    PLAN:    In order of problems listed above:  1. I think she had an element of heart failure in the setting of atrial fibrillation stay on her loop diuretic recheck renal function proBNP.  I think the 2 calcium channel blockers were potentiating edema discontinue diltiazem with heart rates of 60-70 at home and use to utilize a ZIO monitor to decide if she needs rate slowing medication either low-dose beta-blocker or digoxin.  She will continue her current anticoagulant trend her blood pressure and if additional agent is needed consider low-dose ARB   Next appointment: 2 months   Medication Adjustments/Labs and Tests Ordered: Current medicines are reviewed at length with the patient today.  Concerns regarding medicines are outlined above.  No orders of the defined types were placed in this encounter.  No orders of the defined types were placed in this encounter.   Chief Complaint  Patient presents with  . Follow-up  . Leg Swelling  . Shortness of Breath    History of Present Illness:    Karina Chase is a 87 y.o. female with a hx of persistent atrial fibrillation with anticoagulation syncope stage III CKD and breast cancer with surgery and radiation therapy.  She was last seen 07/16/2020.  Her family had called triage with complaints of peripheral edema and shortness of breath wondering if she should discontinue Eliquis.  I initiated her on a loop diuretic and advised office follow-up regarding suspected heart failure.  Compliance with diet, lifestyle and medications: Yes  Her multiple complaints foremost is peripheral edema and  exertional shortness of breath.  She is improved since she started her loop diuretic.  She has had some lightheadedness she says at times it appears as if her pulse is slow but is recorded at 6070 bpm I think we do best to stop her second calcium channel blocker and a 3-day ZIO monitor to assess heart rate.  She will continue her loop diuretic check BMP proBNP and her current anticoagulant.  If additional antihypertensive therapy is needed low-dose ARB would be appropriate.  There have other complaints including rash seen on antibiotic joint pain and hot flashes and I encouraged him to discuss with her primary care physician.  No syncope chest pain or orthopnea.  Is had no bleeding with her anticoagulant Past Medical History:  Diagnosis Date  . Arthritis   . Asthma   . Atrial fibrillation (HCC)    intermittent  . Breast cancer ([place])   . Cancer ([place])    Basal cell skin cancer  . Colon polyp   . GERD (gastroesophageal reflux disease)   . History of shingles   . Hypertension   . Hypertensive heart disease 05/30/2020  . Right groin pain 03/28/2013  . Stress incontinence   . Varicose veins   . Vertigo     Past Surgical History:  Procedure Laterality Date  . ABDOMINAL HYSTERECTOMY  1972   BSO  . BREAST SURGERY     lumpectomy  . SKIN CANCER EXCISION  2014    Current Medications: Current Meds  Medication Sig  .  amLODipine (NORVASC) 5 MG tablet Take 5 mg by mouth daily.  Marland Kitchen apixaban (ELIQUIS) 2.5 MG TABS tablet Take 1 tablet (2.5 mg total) by mouth 2 (two) times daily.  Marland Kitchen diltiazem (CARDIZEM CD) 120 MG 24 hr capsule Take 120 mg by mouth daily.  . furosemide (LASIX) 20 MG tablet Take 1 tablet (20 mg total) by mouth daily.  . nitroGLYCERIN (NITROSTAT) 0.4 MG SL tablet Place 0.4 mg under the tongue every 5 (five) minutes as needed for chest pain.   Current Facility-Administered Medications for the 10/15/20 encounter (Office Visit) with Richardo Priest, MD  Medication  . triamcinolone  acetonide (KENALOG) 10 MG/ML injection 10 mg     Allergies:   Levofloxacin, Keflex [cephalexin], Prilosec [omeprazole], Sulfa antibiotics, Azithromycin, and Penicillins   Social History   Socioeconomic History  . Marital status: Married    Spouse name: Not on file  . Number of children: Not on file  . Years of education: Not on file  . Highest education level: Not on file  Occupational History  . Not on file  Tobacco Use  . Smoking status: Never Smoker  . Smokeless tobacco: Never Used  Substance and Sexual Activity  . Alcohol use: No  . Drug use: No  . Sexual activity: Not Currently    Birth control/protection: Abstinence  Other Topics Concern  . Not on file  Social History Narrative  . Not on file   Social Determinants of Health   Financial Resource Strain: Not on file  Food Insecurity: Not on file  Transportation Needs: Not on file  Physical Activity: Not on file  Stress: Not on file  Social Connections: Not on file     Family History: The patient's family history includes Cancer in her maternal grandmother; Heart disease in her father; Stroke in her mother. ROS:   Please see the history of present illness.    All other systems reviewed and are negative.  EKGs/Labs/Other Studies Reviewed:    The following studies were reviewed today:    Recent Labs: 07/02/2020 creatinine 1.10 BUN 24  Physical Exam:    VS:  BP (!) 172/60   Pulse 82   Ht 5\' 3"  (1.6 m)   Wt 147 lb 12.8 oz (67 kg)   SpO2 98%   BMI 26.18 kg/m     Wt Readings from Last 3 Encounters:  10/15/20 147 lb 12.8 oz (67 kg)  07/16/20 140 lb (63.5 kg)  05/30/20 138 lb (62.6 kg)     GEN: She looks younger than her age Well nourished, well developed in no acute distress HEENT: Normal NECK: No JVD; No carotid bruits LYMPHATICS: No lymphadenopathy CARDIAC: Irregular S1 variable no murmurs, rubs, gallops RESPIRATORY:  Clear to auscultation without rales, wheezing or rhonchi  ABDOMEN: Soft,  non-tender, non-distended MUSCULOSKELETAL: She has marked varicosities and 1+ lower extremity edema edema; No deformity  SKIN: Warm and dry NEUROLOGIC:  Alert and oriented x 3 PSYCHIATRIC:  Normal affect    Signed, Shirlee More, MD  10/15/2020 1:13 PM    [facility]

## 2020-10-15 ENCOUNTER — Other Ambulatory Visit: Payer: Self-pay

## 2020-10-15 ENCOUNTER — Ambulatory Visit (INDEPENDENT_AMBULATORY_CARE_PROVIDER_SITE_OTHER): Payer: Medicare Other

## 2020-10-15 ENCOUNTER — Encounter: Payer: Self-pay | Admitting: Cardiology

## 2020-10-15 ENCOUNTER — Ambulatory Visit: Payer: Medicare Other | Admitting: Cardiology

## 2020-10-15 VITALS — BP 172/60 | HR 82 | Ht 63.0 in | Wt 147.8 lb

## 2020-10-15 DIAGNOSIS — I5032 Chronic diastolic (congestive) heart failure: Secondary | ICD-10-CM

## 2020-10-15 DIAGNOSIS — I4819 Other persistent atrial fibrillation: Secondary | ICD-10-CM

## 2020-10-15 DIAGNOSIS — Z7901 Long term (current) use of anticoagulants: Secondary | ICD-10-CM

## 2020-10-15 DIAGNOSIS — I110 Hypertensive heart disease with heart failure: Secondary | ICD-10-CM | POA: Diagnosis not present

## 2020-10-15 NOTE — Patient Instructions (Signed)
Medication Instructions:  Your physician has recommended you make the following change in your medication:  STOP: Diltiazem  *If you need a refill on your cardiac medications before your next appointment, please call your pharmacy*   Lab Work: Your physician recommends that you return for lab work in: [place] If you have labs (blood work) drawn today and your tests are completely normal, you will receive your results only by: Marland Kitchen MyChart Message (if you have MyChart) OR . A paper copy in the mail If you have any lab test that is abnormal or we need to change your treatment, we will call you to review the results.   Testing/Procedures: A zio monitor was ordered today. It will remain on for 3 days. You will then return monitor and event diary in provided box. It takes 1-2 weeks for report to be downloaded and returned to [place]. We will call you with the results. If monitor falls off or has orange flashing light, please call Zio for further instructions.      Follow-Up: At [facility], you and your health needs are our priority.  As part of our continuing mission to provide you with exceptional heart care, we have created designated Provider Care Teams.  These Care Teams include your primary Cardiologist (physician) and Advanced Practice Providers (APPs -  Physician Assistants and Nurse Practitioners) who all work together to provide you with the care you need, when you need it.  We recommend signing up for the patient portal called "MyChart".  Sign up information is provided on this After Visit Summary.  MyChart is used to connect with patients for Virtual Visits (Telemedicine).  Patients are able to view lab/test results, encounter notes, upcoming appointments, etc.  Non-urgent messages can be sent to your provider as well.   To learn more about what you can do with MyChart, go to [email or web address].    Your next appointment:   2 month(s)  The format for your next  appointment:   In Person  Provider:   Shirlee More, MD   Other Instructions

## 2020-10-16 ENCOUNTER — Telehealth: Payer: Self-pay

## 2020-10-16 LAB — BASIC METABOLIC PANEL
BUN/Creatinine Ratio: 20 (ref 12–28)
BUN: 21 mg/dL (ref 10–36)
CO2: 26 mmol/L (ref 20–29)
Calcium: 9.2 mg/dL (ref 8.7–10.3)
Chloride: 98 mmol/L (ref 96–106)
Creatinine, Ser: 1.07 mg/dL — ABNORMAL HIGH (ref 0.57–1.00)
GFR calc Af Amer: 52 mL/min/{1.73_m2} — ABNORMAL LOW (ref >59)
GFR calc non Af Amer: 45 mL/min/{1.73_m2} — ABNORMAL LOW (ref >59)
Glucose: 239 mg/dL — ABNORMAL HIGH (ref 65–99)
Potassium: 4 mmol/L (ref 3.5–5.2)
Sodium: 138 mmol/L (ref 134–144)

## 2020-10-16 LAB — PRO B NATRIURETIC PEPTIDE: NT-Pro BNP: 1800 pg/mL — ABNORMAL HIGH (ref 0–738)

## 2020-10-16 NOTE — Telephone Encounter (Signed)
Patient notified of results and verbalized understanding.  

## 2020-10-16 NOTE — Telephone Encounter (Signed)
-----   Message from Richardo Priest, MD sent at 10/16/2020 12:52 PM EST ----- Stable continue her current diuretic

## 2020-10-24 DIAGNOSIS — I4819 Other persistent atrial fibrillation: Secondary | ICD-10-CM | POA: Diagnosis not present

## 2020-10-25 ENCOUNTER — Telehealth: Payer: Self-pay

## 2020-10-25 NOTE — Telephone Encounter (Signed)
Left message on patients voicemail to please return our call.   

## 2020-10-25 NOTE — Telephone Encounter (Signed)
-----   Message from Richardo Priest, MD sent at 10/25/2020  9:54 AM EST ----- Her monitor is a good result her heart rate is nicely controlled with atrial fibrillation does not need additional medication and no change in her treatment.

## 2020-10-28 ENCOUNTER — Telehealth: Payer: Self-pay

## 2020-10-28 NOTE — Telephone Encounter (Signed)
-----   Message from Richardo Priest, MD sent at 10/25/2020  9:54 AM EST ----- Her monitor is a good result her heart rate is nicely controlled with atrial fibrillation does not need additional medication and no change in her treatment.

## 2020-10-28 NOTE — Telephone Encounter (Signed)
Spoke with patient regarding results and recommendation.  Patient verbalizes understanding and is agreeable to plan of care. Advised patient to call back with any issues or concerns.  

## 2020-11-05 DIAGNOSIS — L814 Other melanin hyperpigmentation: Secondary | ICD-10-CM | POA: Diagnosis not present

## 2020-11-05 DIAGNOSIS — D485 Neoplasm of uncertain behavior of skin: Secondary | ICD-10-CM | POA: Diagnosis not present

## 2020-11-05 DIAGNOSIS — D2239 Melanocytic nevi of other parts of face: Secondary | ICD-10-CM | POA: Diagnosis not present

## 2020-11-05 DIAGNOSIS — L821 Other seborrheic keratosis: Secondary | ICD-10-CM | POA: Diagnosis not present

## 2020-11-05 DIAGNOSIS — L820 Inflamed seborrheic keratosis: Secondary | ICD-10-CM | POA: Diagnosis not present

## 2020-12-13 ENCOUNTER — Ambulatory Visit: Payer: Medicare Other | Admitting: Cardiology

## 2020-12-13 ENCOUNTER — Other Ambulatory Visit: Payer: Self-pay

## 2020-12-13 ENCOUNTER — Encounter: Payer: Self-pay | Admitting: Cardiology

## 2020-12-13 VITALS — BP 157/68 | HR 78 | Ht 63.0 in | Wt 142.6 lb

## 2020-12-13 DIAGNOSIS — Z7901 Long term (current) use of anticoagulants: Secondary | ICD-10-CM | POA: Diagnosis not present

## 2020-12-13 DIAGNOSIS — I4819 Other persistent atrial fibrillation: Secondary | ICD-10-CM | POA: Diagnosis not present

## 2020-12-13 DIAGNOSIS — R601 Generalized edema: Secondary | ICD-10-CM | POA: Insufficient documentation

## 2020-12-13 DIAGNOSIS — I110 Hypertensive heart disease with heart failure: Secondary | ICD-10-CM | POA: Diagnosis not present

## 2020-12-13 DIAGNOSIS — N3281 Overactive bladder: Secondary | ICD-10-CM

## 2020-12-13 DIAGNOSIS — H9190 Unspecified hearing loss, unspecified ear: Secondary | ICD-10-CM | POA: Insufficient documentation

## 2020-12-13 DIAGNOSIS — E119 Type 2 diabetes mellitus without complications: Secondary | ICD-10-CM

## 2020-12-13 DIAGNOSIS — I5032 Chronic diastolic (congestive) heart failure: Secondary | ICD-10-CM

## 2020-12-13 DIAGNOSIS — N189 Chronic kidney disease, unspecified: Secondary | ICD-10-CM

## 2020-12-13 HISTORY — DX: Overactive bladder: N32.81

## 2020-12-13 HISTORY — DX: Chronic kidney disease, unspecified: N18.9

## 2020-12-13 HISTORY — DX: Generalized edema: R60.1

## 2020-12-13 HISTORY — DX: Type 2 diabetes mellitus without complications: E11.9

## 2020-12-13 HISTORY — DX: Unspecified hearing loss, unspecified ear: H91.90

## 2020-12-13 MED ORDER — FUROSEMIDE 20 MG PO TABS: 20.0000 mg | ORAL_TABLET | ORAL | 3 refills | Status: DC

## 2020-12-13 NOTE — Patient Instructions (Signed)
Medication Instructions:  Your physician has recommended you make the following change in your medication:  DECREASE: Furosemide to every other day.  *If you need a refill on your cardiac medications before your next appointment, please call your pharmacy*   Lab Work: Your physician recommends that you return for lab work in: [place], [place] If you have labs (blood work) drawn today and your tests are completely normal, you will receive your results only by: Marland Kitchen MyChart Message (if you have MyChart) OR . A paper copy in the mail If you have any lab test that is abnormal or we need to change your treatment, we will call you to review the results.   Testing/Procedures: None   Follow-Up: At [facility], you and your health needs are our priority.  As part of our continuing mission to provide you with exceptional heart care, we have created designated Provider Care Teams.  These Care Teams include your primary Cardiologist (physician) and Advanced Practice Providers (APPs -  Physician Assistants and Nurse Practitioners) who all work together to provide you with the care you need, when you need it.  We recommend signing up for the patient portal called "MyChart".  Sign up information is provided on this After Visit Summary.  MyChart is used to connect with patients for Virtual Visits (Telemedicine).  Patients are able to view lab/test results, encounter notes, upcoming appointments, etc.  Non-urgent messages can be sent to your provider as well.   To learn more about what you can do with MyChart, go to [email or web address].    Your next appointment:   6 month(s)  The format for your next appointment:   In Person  Provider:   Shirlee More, MD   Other Instructions

## 2020-12-13 NOTE — Progress Notes (Signed)
Cardiology Office Note:    Date:  12/13/2020   ID:  Karina Chase, DOB [birth year], MRN [id number]  PCP:  Shirline Frees, MD  Cardiologist:  Shirlee More, MD    Referring MD: Shirline Frees, MD    ASSESSMENT:    1. Hypertensive heart disease with chronic diastolic congestive heart failure (HCC)   2. Persistent atrial fibrillation ([place])   3. Chronic anticoagulation    PLAN:    In order of problems listed above:  1. Stable BP at target for age greater than 2 no evidence of decompensated heart failure decrease diuretic to every other day with close supervision by her daughter and continue amlodipine.  Recheck BMP proBNP today 2. Rate is well controlled without suppressant therapy on reduced dose anticoagulant   Next appointment: 6 months   Medication Adjustments/Labs and Tests Ordered: Current medicines are reviewed at length with the patient today.  Concerns regarding medicines are outlined above.  Orders Placed This Encounter  Procedures  . Basic metabolic panel  . Pro b natriuretic peptide (BNP)   Meds ordered this encounter  Medications  . furosemide (LASIX) 20 MG tablet    Sig: Take 1 tablet (20 mg total) by mouth every other day.    Dispense:  45 tablet    Refill:  3    Chief Complaint  Patient presents with  . Follow-up  . Congestive Heart Failure  . Atrial Fibrillation    History of Present Illness:    Karina Chase is a 87 y.o. female with a hx of persistent atrial fibrillation with anticoagulation and syncope stage III CKD and a history of breast cancer surgery and radiation therapy.  She was last seen 10/15/2020 felt to have mild heart failure was placed on a loop diuretic and medications altered to avoid edema with dual calcium channel blocker therapy.  Her N-terminal proBNP was elevated at 1800.  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 Since diuretic no shortness of breath edema no cardiovascular complaints of chest pain palpitation or syncope. Her monitor showed good heart rate control and tolerates her anticoagulant without bleeding. She is concerned that she is taking too much diuretic will decrease every other day and her daughter will go back to daily if she sees any concerns of shortness of breath or edema and recheck renal function proBNP today.  Following the visit she used a event monitor for 4 days showing atrial fibrillation with good heart rate control. Past Medical History:  Diagnosis Date  . Arthritis   . Asthma   . Atrial fibrillation (HCC)    intermittent  . Breast cancer ([place])   . Cancer ([place])    Basal cell skin cancer  . Colon polyp   . GERD (gastroesophageal reflux disease)   . History of shingles   . Hypertension   . Hypertensive heart disease 05/30/2020  . Right groin pain 03/28/2013  . Stress incontinence   . Varicose veins   . Vertigo     Past Surgical History:  Procedure Laterality Date  . ABDOMINAL HYSTERECTOMY  1972   BSO  . BREAST SURGERY     lumpectomy  . SKIN CANCER EXCISION  2014    Current Medications: Current Meds  Medication Sig  . albuterol (VENTOLIN HFA) 108 (90 Base) MCG/ACT inhaler Inhale 1 puff into the lungs every 6 (six) hours as needed for shortness of breath or wheezing.  Marland Kitchen  amLODipine (NORVASC) 5 MG tablet Take 5 mg by mouth daily.  Marland Kitchen apixaban (ELIQUIS) 2.5 MG TABS tablet Take 1 tablet (2.5 mg total) by mouth 2 (two) times daily.  . nitroGLYCERIN (NITROSTAT) 0.4 MG SL tablet Place 0.4 mg under the tongue every 5 (five) minutes as needed for chest pain.  . [DISCONTINUED] furosemide (LASIX) 20 MG tablet Take 1 tablet (20 mg total) by mouth daily.   Current Facility-Administered Medications for the 12/13/20 encounter (Office Visit) with Richardo Priest, MD  Medication  . triamcinolone acetonide (KENALOG) 10 MG/ML injection 10 mg     Allergies:    Levofloxacin, Keflex [cephalexin], Prilosec [omeprazole], Sulfa antibiotics, Sulfamethoxazole-trimethoprim, Azithromycin, Penicillin g, and Penicillins   Social History   Socioeconomic History  . Marital status: Married    Spouse name: Not on file  . Number of children: Not on file  . Years of education: Not on file  . Highest education level: Not on file  Occupational History  . Not on file  Tobacco Use  . Smoking status: Never Smoker  . Smokeless tobacco: Never Used  Substance and Sexual Activity  . Alcohol use: No  . Drug use: No  . Sexual activity: Not Currently    Birth control/protection: Abstinence  Other Topics Concern  . Not on file  Social History Narrative  . Not on file   Social Determinants of Health   Financial Resource Strain: Not on file  Food Insecurity: Not on file  Transportation Needs: Not on file  Physical Activity: Not on file  Stress: Not on file  Social Connections: Not on file     Family History: The patient's family history includes Cancer in her maternal grandmother; Heart disease in her father; Stroke in her mother. ROS:   Please see the history of present illness.    All other systems reviewed and are negative.  EKGs/Labs/Other Studies Reviewed:    The following studies were reviewed today:    Recent Labs: 10/15/2020: BUN 21; Creatinine, Ser 1.07; NT-Pro BNP 1,800; Potassium 4.0; Sodium 138  Recent Lipid Panel No results found for: CHOL, TRIG, HDL, CHOLHDL, VLDL, LDLCALC, LDLDIRECT  Physical Exam:    VS:  BP (!) 157/68 (BP Location: Left Arm, Patient Position: Sitting)   Pulse 78   Ht 5\' 3"  (1.6 m)   Wt 142 lb 9.6 oz (64.7 kg)   SpO2 98%   BMI 25.26 kg/m     Wt Readings from Last 3 Encounters:  12/13/20 142 lb 9.6 oz (64.7 kg)  10/15/20 147 lb 12.8 oz (67 kg)  07/16/20 140 lb (63.5 kg)     GEN: Looks younger than her age well nourished, well developed in no acute distress HEENT: Normal NECK: No JVD; No carotid  bruits LYMPHATICS: No lymphadenopathy CARDIAC: Irregular rhythm no murmurs, rubs, gallops RESPIRATORY:  Clear to auscultation without rales, wheezing or rhonchi  ABDOMEN: Soft, non-tender, non-distended MUSCULOSKELETAL:  No edema; No deformity  SKIN: Warm and dry NEUROLOGIC:  Alert and oriented x 3 PSYCHIATRIC:  Normal affect    Signed, Shirlee More, MD  12/13/2020 2:47 PM    [place]

## 2020-12-14 LAB — BASIC METABOLIC PANEL
BUN/Creatinine Ratio: 18 (ref 12–28)
BUN: 21 mg/dL (ref 10–36)
CO2: 24 mmol/L (ref 20–29)
Calcium: 9.3 mg/dL (ref 8.7–10.3)
Chloride: 100 mmol/L (ref 96–106)
Creatinine, Ser: 1.18 mg/dL — ABNORMAL HIGH (ref 0.57–1.00)
Glucose: 151 mg/dL — ABNORMAL HIGH (ref 65–99)
Potassium: 4.1 mmol/L (ref 3.5–5.2)
Sodium: 140 mmol/L (ref 134–144)
eGFR: 43 mL/min/{1.73_m2} — ABNORMAL LOW (ref >59)

## 2020-12-14 LAB — PRO B NATRIURETIC PEPTIDE: NT-Pro BNP: 2698 pg/mL — ABNORMAL HIGH (ref 0–738)

## 2020-12-16 ENCOUNTER — Telehealth: Payer: Self-pay

## 2020-12-16 NOTE — Telephone Encounter (Signed)
Spoke with patient regarding results and recommendation.  Patient verbalizes understanding and is agreeable to plan of care. Advised patient to call back with any issues or concerns.  

## 2020-12-16 NOTE — Telephone Encounter (Signed)
-----   Message from Richardo Priest, MD sent at 12/14/2020  2:08 PM EDT ----- Good result but the test for heart failure shows that she will need to go back to taking her furosemide every day not every other day as we discussed in the office.

## 2021-01-08 DIAGNOSIS — N189 Chronic kidney disease, unspecified: Secondary | ICD-10-CM | POA: Diagnosis not present

## 2021-01-08 DIAGNOSIS — I10 Essential (primary) hypertension: Secondary | ICD-10-CM | POA: Diagnosis not present

## 2021-01-08 DIAGNOSIS — I480 Paroxysmal atrial fibrillation: Secondary | ICD-10-CM | POA: Diagnosis not present

## 2021-01-08 DIAGNOSIS — E119 Type 2 diabetes mellitus without complications: Secondary | ICD-10-CM | POA: Diagnosis not present

## 2021-05-21 DIAGNOSIS — L821 Other seborrheic keratosis: Secondary | ICD-10-CM | POA: Diagnosis not present

## 2021-05-21 DIAGNOSIS — D225 Melanocytic nevi of trunk: Secondary | ICD-10-CM | POA: Diagnosis not present

## 2021-05-21 DIAGNOSIS — L820 Inflamed seborrheic keratosis: Secondary | ICD-10-CM | POA: Diagnosis not present

## 2021-05-21 DIAGNOSIS — D2239 Melanocytic nevi of other parts of face: Secondary | ICD-10-CM | POA: Diagnosis not present

## 2021-05-21 DIAGNOSIS — D1801 Hemangioma of skin and subcutaneous tissue: Secondary | ICD-10-CM | POA: Diagnosis not present

## 2021-07-13 NOTE — Progress Notes (Signed)
Cardiology Office Note:    Date:  07/14/2021   ID:  Karina Chase, DOB May 04, 1925, MRN [id number]  PCP:  Shirline Frees, MD  Cardiologist:  Shirlee More, MD    Referring MD: Shirline Frees, MD    ASSESSMENT:    1. Persistent atrial fibrillation ([place])   2. Chronic anticoagulation   3. Hypertensive heart disease with chronic diastolic congestive heart failure ([place])    PLAN:    In order of problems listed above:  She continues to do well asymptomatic from atrial fibrillation controlled rate without suppressant treatment and will continue reduced dose anticoagulation. Stable hypertension BP at target range greater than 90 continue Rate limiting calcium channel blocker   Next appointment: 6 months   Medication Adjustments/Labs and Tests Ordered: Current medicines are reviewed at length with the patient today.  Concerns regarding medicines are outlined above.  Orders Placed This Encounter  Procedures   EKG 12-Lead    Meds ordered this encounter  Medications   nitroGLYCERIN (NITROSTAT) 0.4 MG SL tablet    Sig: Place 1 tablet (0.4 mg total) under the tongue every 5 (five) minutes as needed for chest pain.    Dispense:  25 tablet    Refill:  3     Chief Complaint  Patient presents with   Follow-up   Atrial Fibrillation    History of Present Illness:    Karina Chase is a 87 y.o. female with a hx of persistent atrial fibrillation on chronic anticoagulation stage III CKD and heart failure last seen 12/13/2020.  Compliance with diet, lifestyle and medications: Yes  She has had no palpitations syncope shortness of breath edema or chest pain. Tolerates her anticoagulant without bleeding complication She is due for lab work next week.  PCP office She realizes that life is changing she is unsteady and plans to transition to independent living at [facility].  She has not had a fall. Past Medical History:  Diagnosis Date   Arthritis    Asthma    Atrial  fibrillation (HCC)    intermittent   Breast cancer (HCC)    Cancer (HCC)    Basal cell skin cancer   Colon polyp    GERD (gastroesophageal reflux disease)    History of shingles    Hypertension    Hypertensive heart disease 05/30/2020   Right groin pain 03/28/2013   Stress incontinence    Varicose veins    Vertigo     Past Surgical History:  Procedure Laterality Date   ABDOMINAL HYSTERECTOMY  1972   BSO   BREAST SURGERY     lumpectomy   SKIN CANCER EXCISION  2014    Current Medications: Current Meds  Medication Sig   amLODipine (NORVASC) 5 MG tablet Take 5 mg by mouth daily.   apixaban (ELIQUIS) 2.5 MG TABS tablet Take 1 tablet (2.5 mg total) by mouth 2 (two) times daily.   furosemide (LASIX) 20 MG tablet Take 20 mg by mouth daily.   nitroGLYCERIN (NITROSTAT) 0.4 MG SL tablet Place 1 tablet (0.4 mg total) under the tongue every 5 (five) minutes as needed for chest pain.   [DISCONTINUED] nitroGLYCERIN (NITROSTAT) 0.4 MG SL tablet Place 0.4 mg under the tongue every 5 (five) minutes as needed for chest pain.   Current Facility-Administered Medications for the 07/14/21 encounter (Office Visit) with Richardo Priest, MD  Medication   triamcinolone acetonide (KENALOG) 10 MG/ML injection 10 mg     Allergies:   Levofloxacin, Keflex [cephalexin], Prilosec [omeprazole],  Sulfa antibiotics, Sulfamethoxazole-trimethoprim, Azithromycin, Penicillin g, and Penicillins   Social History   Socioeconomic History   Marital status: Married    Spouse name: Not on file   Number of children: Not on file   Years of education: Not on file   Highest education level: Not on file  Occupational History   Not on file  Tobacco Use   Smoking status: Never   Smokeless tobacco: Never  Substance and Sexual Activity   Alcohol use: No   Drug use: No   Sexual activity: Not Currently    Birth control/protection: Abstinence  Other Topics Concern   Not on file  Social History Narrative   Not on file    Social Determinants of Health   Financial Resource Strain: Not on file  Food Insecurity: Not on file  Transportation Needs: Not on file  Physical Activity: Not on file  Stress: Not on file  Social Connections: Not on file     Family History: The patient's family history includes Cancer in her maternal grandmother; Heart disease in her father; Stroke in her mother. ROS:   Please see the history of present illness.    All other systems reviewed and are negative.  EKGs/Labs/Other Studies Reviewed:    The following studies were reviewed today:  EKG:  EKG ordered today and personally reviewed.  The ekg ordered today demonstrates atrial fibrillation controlled ventricular rate  Recent Labs: 12/13/2020: BUN 21; Creatinine, Ser 1.18; NT-Pro BNP 2,698; Potassium 4.1; Sodium 140  Recent Lipid Panel No results found for: CHOL, TRIG, HDL, CHOLHDL, VLDL, LDLCALC, LDLDIRECT  Physical Exam:    VS:  BP 140/60 (BP Location: Right Arm, Patient Position: Sitting, Cuff Size: Normal)   Pulse 68   Ht 5\' 3"  (1.6 m)   Wt 137 lb (62.1 kg)   SpO2 96%   BMI 24.27 kg/m     Wt Readings from Last 3 Encounters:  07/14/21 137 lb (62.1 kg)  12/13/20 142 lb 9.6 oz (64.7 kg)  10/15/20 147 lb 12.8 oz (67 kg)     GEN: Looks younger than her age well nourished, well developed in no acute distress HEENT: Normal NECK: No JVD; No carotid bruits LYMPHATICS: No lymphadenopathy CARDIAC: Irregular rate and rhythm no murmurs, rubs, gallops RESPIRATORY:  Clear to auscultation without rales, wheezing or rhonchi  ABDOMEN: Soft, non-tender, non-distended MUSCULOSKELETAL:  No edema; No deformity  SKIN: Warm and dry NEUROLOGIC:  Alert and oriented x 3 PSYCHIATRIC:  Normal affect    Signed, Shirlee More, MD  07/14/2021 1:12 PM    [facility]

## 2021-07-14 ENCOUNTER — Other Ambulatory Visit: Payer: Self-pay

## 2021-07-14 ENCOUNTER — Ambulatory Visit: Payer: Medicare Other | Admitting: Cardiology

## 2021-07-14 ENCOUNTER — Encounter: Payer: Self-pay | Admitting: Cardiology

## 2021-07-14 VITALS — BP 140/60 | HR 68 | Ht 63.0 in | Wt 137.0 lb

## 2021-07-14 DIAGNOSIS — I110 Hypertensive heart disease with heart failure: Secondary | ICD-10-CM | POA: Diagnosis not present

## 2021-07-14 DIAGNOSIS — I4819 Other persistent atrial fibrillation: Secondary | ICD-10-CM

## 2021-07-14 DIAGNOSIS — Z7901 Long term (current) use of anticoagulants: Secondary | ICD-10-CM | POA: Diagnosis not present

## 2021-07-14 DIAGNOSIS — I5032 Chronic diastolic (congestive) heart failure: Secondary | ICD-10-CM | POA: Diagnosis not present

## 2021-07-14 MED ORDER — NITROGLYCERIN 0.4 MG SL SUBL: 0.4000 mg | SUBLINGUAL_TABLET | SUBLINGUAL | 3 refills | Status: DC | PRN

## 2021-07-14 NOTE — Patient Instructions (Signed)

## 2021-07-24 DIAGNOSIS — Z0000 Encounter for general adult medical examination without abnormal findings: Secondary | ICD-10-CM | POA: Diagnosis not present

## 2021-07-24 DIAGNOSIS — Z23 Encounter for immunization: Secondary | ICD-10-CM | POA: Diagnosis not present

## 2021-07-24 DIAGNOSIS — N189 Chronic kidney disease, unspecified: Secondary | ICD-10-CM | POA: Diagnosis not present

## 2021-07-24 DIAGNOSIS — E119 Type 2 diabetes mellitus without complications: Secondary | ICD-10-CM | POA: Diagnosis not present

## 2021-07-24 DIAGNOSIS — I480 Paroxysmal atrial fibrillation: Secondary | ICD-10-CM | POA: Diagnosis not present

## 2021-07-24 DIAGNOSIS — R601 Generalized edema: Secondary | ICD-10-CM | POA: Diagnosis not present

## 2021-07-24 DIAGNOSIS — I10 Essential (primary) hypertension: Secondary | ICD-10-CM | POA: Diagnosis not present

## 2021-07-24 DIAGNOSIS — N3281 Overactive bladder: Secondary | ICD-10-CM | POA: Diagnosis not present

## 2021-09-16 ENCOUNTER — Other Ambulatory Visit: Payer: Self-pay | Admitting: Cardiology

## 2021-09-16 NOTE — Telephone Encounter (Signed)
Prescription refill request for Eliquis received. Indication: Afib  Last office visit: 07/14/21 [facility])  Scr: 1.18 (12/13/20)  Age: 88 Weight: 62.1kg  Per Dr Joya Gaskins note on 1//7/22 -  appropriate dose.  Refill sent to requested pharmacy.

## 2021-09-25 ENCOUNTER — Other Ambulatory Visit: Payer: Self-pay | Admitting: Cardiology

## 2021-10-14 DIAGNOSIS — H524 Presbyopia: Secondary | ICD-10-CM | POA: Diagnosis not present

## 2021-10-14 DIAGNOSIS — H353131 Nonexudative age-related macular degeneration, bilateral, early dry stage: Secondary | ICD-10-CM | POA: Diagnosis not present

## 2021-10-14 DIAGNOSIS — H52223 Regular astigmatism, bilateral: Secondary | ICD-10-CM | POA: Diagnosis not present

## 2021-12-16 DIAGNOSIS — C4490 Unspecified malignant neoplasm of skin, unspecified: Secondary | ICD-10-CM | POA: Diagnosis not present

## 2021-12-23 DIAGNOSIS — L820 Inflamed seborrheic keratosis: Secondary | ICD-10-CM | POA: Diagnosis not present

## 2021-12-23 DIAGNOSIS — L570 Actinic keratosis: Secondary | ICD-10-CM | POA: Diagnosis not present

## 2021-12-23 DIAGNOSIS — C44319 Basal cell carcinoma of skin of other parts of face: Secondary | ICD-10-CM | POA: Diagnosis not present

## 2022-01-01 DIAGNOSIS — H353131 Nonexudative age-related macular degeneration, bilateral, early dry stage: Secondary | ICD-10-CM | POA: Diagnosis not present

## 2022-01-06 DIAGNOSIS — D0339 Melanoma in situ of other parts of face: Secondary | ICD-10-CM | POA: Diagnosis not present

## 2022-01-06 DIAGNOSIS — C44319 Basal cell carcinoma of skin of other parts of face: Secondary | ICD-10-CM | POA: Diagnosis not present

## 2022-01-21 DIAGNOSIS — I10 Essential (primary) hypertension: Secondary | ICD-10-CM | POA: Diagnosis not present

## 2022-01-21 DIAGNOSIS — R945 Abnormal results of liver function studies: Secondary | ICD-10-CM | POA: Diagnosis not present

## 2022-01-21 DIAGNOSIS — N3281 Overactive bladder: Secondary | ICD-10-CM | POA: Diagnosis not present

## 2022-01-21 DIAGNOSIS — E119 Type 2 diabetes mellitus without complications: Secondary | ICD-10-CM | POA: Diagnosis not present

## 2022-01-21 DIAGNOSIS — H9193 Unspecified hearing loss, bilateral: Secondary | ICD-10-CM | POA: Diagnosis not present

## 2022-01-21 DIAGNOSIS — I480 Paroxysmal atrial fibrillation: Secondary | ICD-10-CM | POA: Diagnosis not present

## 2022-01-21 DIAGNOSIS — L089 Local infection of the skin and subcutaneous tissue, unspecified: Secondary | ICD-10-CM | POA: Diagnosis not present

## 2022-01-21 DIAGNOSIS — R7989 Other specified abnormal findings of blood chemistry: Secondary | ICD-10-CM | POA: Diagnosis not present

## 2022-01-29 ENCOUNTER — Other Ambulatory Visit: Payer: Self-pay

## 2022-01-29 ENCOUNTER — Telehealth: Payer: Self-pay

## 2022-01-29 ENCOUNTER — Telehealth: Payer: Self-pay | Admitting: Cardiology

## 2022-01-29 MED ORDER — FUROSEMIDE 20 MG PO TABS: 20.0000 mg | ORAL_TABLET | Freq: Two times a day (BID) | ORAL | 3 refills | Status: DC

## 2022-01-29 NOTE — Telephone Encounter (Signed)
Called to discuss pt's symptoms x 2 reached Dianne's phone- Unable to leave message- VM full.

## 2022-01-29 NOTE — Telephone Encounter (Signed)
Called patient regarding the swelling in her feet and ankles. Her daughter who takes care of her states that it has been happening for 3 days. It is worse in the morning and the swelling doesn't go down during the day. Patient is currently taking lasix daily and she is slightly short of breath. I informed Dr. Bettina Gavia of the patients condition and he recommended that we increase her lasix from daily to twice daily. I informed the patient's daughter and she had no further questions.

## 2022-01-29 NOTE — Telephone Encounter (Signed)
Pt c/o swelling: STAT is pt has developed SOB within 24 hours  How much weight have you gained and in what time span? no  If swelling, where is the swelling located? In foot and ankles   Are you currently taking a fluid pill? yes  Are you currently SOB?  Yes   Do you have a log of your daily weights (if so, list)?    Have you gained 3 pounds in a day or 5 pounds in a week?  no  Have you traveled recently? no

## 2022-02-10 DIAGNOSIS — D0339 Melanoma in situ of other parts of face: Secondary | ICD-10-CM | POA: Diagnosis not present

## 2022-02-17 ENCOUNTER — Other Ambulatory Visit: Payer: Self-pay | Admitting: Cardiology

## 2022-02-17 NOTE — Telephone Encounter (Signed)
Prescription refill request for Eliquis received. Indication: Atrial Fib Last office visit: 07/14/21  Rinaldo Cloud MD Scr: 1.0 on 01/21/22 Age:  88 Weight: 62.1kg  Per Dr Joya Gaskins last office note he would like to continue low doe Eliquis 2.'5mg'$  twice daily due to pt's age and borderline weight of 62kg.  Refill approved.

## 2022-03-17 ENCOUNTER — Telehealth: Payer: Self-pay | Admitting: Cardiology

## 2022-03-17 NOTE — Telephone Encounter (Signed)
Patient daughter called to report her mother's BP that it has been running low.  Pt c/o BP issue: STAT if pt c/o blurred vision, one-sided weakness or slurred speech  1. What are your last 5 BP readings?  125/59 after her meds 153/83 before meds  154/63 in the afternoon 119/63 130/59 after her meds 130/72 before her meds    2. Are you having any other symptoms (ex. Dizziness, headache, blurred vision, passed out)? She feels light and kind of weak.   3. What is your BP issue? Low BP.   Daughter states she thinks her medication needs to be adjusted.

## 2022-03-18 ENCOUNTER — Other Ambulatory Visit: Payer: Self-pay

## 2022-03-18 NOTE — Telephone Encounter (Signed)
Patient reports that her diastolic blood pressure ranges from the 80's - the low 50's after she takes her blood pressure medication. When this happens she states that she becomes light headed and weak. The blood pressures are listed below. I will forward this to Dr. Bettina Gavia for guidance.   125/59 after her meds 153/83 before meds  154/63 in the afternoon 119/63 130/59 after her meds 130/72 before her meds

## 2022-03-18 NOTE — Telephone Encounter (Signed)
Called patient and informed her of Dr. Joya Gaskins response regarding her blood pressure concerns. Also explained that if she continues to feel light headed or weak to set - up and appointment with her PCP per Dr. Bettina Gavia. Patient had no further questions at this time.

## 2022-04-10 ENCOUNTER — Ambulatory Visit: Payer: Medicare Other | Admitting: Cardiology

## 2022-04-10 ENCOUNTER — Encounter: Payer: Self-pay | Admitting: Cardiology

## 2022-04-10 VITALS — BP 140/60 | HR 64 | Ht 63.0 in | Wt 134.0 lb

## 2022-04-10 DIAGNOSIS — I5032 Chronic diastolic (congestive) heart failure: Secondary | ICD-10-CM

## 2022-04-10 DIAGNOSIS — Z7901 Long term (current) use of anticoagulants: Secondary | ICD-10-CM | POA: Diagnosis not present

## 2022-04-10 DIAGNOSIS — I110 Hypertensive heart disease with heart failure: Secondary | ICD-10-CM

## 2022-04-10 DIAGNOSIS — N183 Chronic kidney disease, stage 3 unspecified: Secondary | ICD-10-CM | POA: Diagnosis not present

## 2022-04-10 DIAGNOSIS — I4819 Other persistent atrial fibrillation: Secondary | ICD-10-CM | POA: Diagnosis not present

## 2022-04-10 NOTE — Patient Instructions (Signed)

## 2022-04-10 NOTE — Progress Notes (Signed)
Cardiology Office Note:    Date:  04/10/2022   ID:  Karina Chase, DOB 08-30-1925, MRN [id number]  PCP:  Shirline Frees, MD  Cardiologist:  Shirlee More, MD    Referring MD: Shirline Frees, MD    ASSESSMENT:    1. Persistent atrial fibrillation ([place])   2. Chronic anticoagulation   3. Hypertensive heart disease with chronic diastolic congestive heart failure (HCC)   4. Stage 3 chronic kidney disease, unspecified whether stage 3a or 3b CKD (HCC)    PLAN:    In order of problems listed above:  Karina Chase has done well with atrial fibrillation rate controlled without suppressive treatment she will continue reduced dose anticoagulant appropriate for age CKD weight without bleeding complication. Heart failure is nicely compensated she is not having symptoms has no fluid overload we will continue her current diuretic and her antihypertensive agent her blood pressure is at target with age greater than 84. Stable CKD   Next appointment: 6 months   Medication Adjustments/Labs and Tests Ordered: Current medicines are reviewed at length with the patient today.  Concerns regarding medicines are outlined above.  No orders of the defined types were placed in this encounter.  No orders of the defined types were placed in this encounter.   Chief Complaint  Patient presents with   Follow-up   Atrial Fibrillation   Congestive Heart Failure    History of Present Illness:    Karina Chase is a 88 y.o. female with a hx of persistent atrial fibrillation with rate control with chronic anticoagulation hypertensive heart disease with chronic diastolic heart failure and stage III CKD last seen 07/14/2021.  Compliance with diet, lifestyle and medications: Yes   she continues to be active and has a good quality of life. She tolerates her anticoagulant has had no bleeding. With her current diuretic she has no edema shortness of breath exercise intolerance chest pain palpitation or syncope  and has not required nitroglycerin Recent labs 07/24/2021 with LDL of 88 cholesterol 164 A1c 6.5 normal hemoglobin 12.3 creatinine 1.0 potassium 4.7 Past Medical History:  Diagnosis Date   Arthritis    Asthma    Atrial fibrillation (HCC)    intermittent   Breast cancer (HCC)    Cancer ([place])    Basal cell skin cancer   Colon polyp    GERD (gastroesophageal reflux disease)    History of shingles    Hypertension    Hypertensive heart disease 05/30/2020   Right groin pain 03/28/2013   Stress incontinence    Varicose veins    Vertigo     Past Surgical History:  Procedure Laterality Date   ABDOMINAL HYSTERECTOMY  1972   BSO   BREAST SURGERY     lumpectomy   SKIN CANCER EXCISION  2014    Current Medications: Current Meds  Medication Sig   amLODipine (NORVASC) 5 MG tablet Take 5 mg by mouth daily.   ELIQUIS 2.5 MG TABS tablet TAKE ONE TABLET BY MOUTH TWICE DAILY   furosemide (LASIX) 20 MG tablet Take 1 tablet (20 mg total) by mouth 2 (two) times daily.   nitroGLYCERIN (NITROSTAT) 0.4 MG SL tablet Place 1 tablet (0.4 mg total) under the tongue every 5 (five) minutes as needed for chest pain.   Current Facility-Administered Medications for the 04/10/22 encounter (Office Visit) with Richardo Priest, MD  Medication   triamcinolone acetonide (KENALOG) 10 MG/ML injection 10 mg     Allergies:   Levofloxacin, Keflex [cephalexin], Prilosec [omeprazole],  Sulfa antibiotics, Sulfamethoxazole-trimethoprim, Azithromycin, Penicillin g, and Penicillins   Social History   Socioeconomic History   Marital status: Married    Spouse name: Not on file   Number of children: Not on file   Years of education: Not on file   Highest education level: Not on file  Occupational History   Not on file  Tobacco Use   Smoking status: Never   Smokeless tobacco: Never  Substance and Sexual Activity   Alcohol use: No   Drug use: No   Sexual activity: Not Currently    Birth control/protection:  Abstinence  Other Topics Concern   Not on file  Social History Narrative   Not on file   Social Determinants of Health   Financial Resource Strain: Not on file  Food Insecurity: Not on file  Transportation Needs: Not on file  Physical Activity: Not on file  Stress: Not on file  Social Connections: Not on file     Family History: The patient's family history includes Cancer in her maternal grandmother; Heart disease in her father; Stroke in her mother. ROS:   Please see the history of present illness.    All other systems reviewed and are negative.  EKGs/Labs/Other Studies Reviewed:    The following studies were reviewed today: See history  Physical Exam:    VS:  BP (!) 140/60 (BP Location: Right Arm, Patient Position: Sitting, Cuff Size: Normal)   Pulse 64   Ht '5\' 3"'$  (1.6 m)   Wt 134 lb (60.8 kg)   SpO2 95%   BMI 23.74 kg/m     Wt Readings from Last 3 Encounters:  04/10/22 134 lb (60.8 kg)  07/14/21 137 lb (62.1 kg)  12/13/20 142 lb 9.6 oz (64.7 kg)     GEN: Appears younger than her age well nourished, well developed in no acute distress HEENT: Normal NECK: No JVD; No carotid bruits LYMPHATICS: No lymphadenopathy CARDIAC: Irregular rate and rhythm  no murmurs, rubs, gallops RESPIRATORY:  Clear to auscultation without rales, wheezing or rhonchi  ABDOMEN: Soft, non-tender, non-distended MUSCULOSKELETAL:  No edema; No deformity  SKIN: Warm and dry NEUROLOGIC:  Alert and oriented x 3 PSYCHIATRIC:  Normal affect    Signed, Shirlee More, MD  04/10/2022 1:41 PM     [facility]

## 2022-07-07 DIAGNOSIS — D225 Melanocytic nevi of trunk: Secondary | ICD-10-CM | POA: Diagnosis not present

## 2022-07-07 DIAGNOSIS — Z85820 Personal history of malignant melanoma of skin: Secondary | ICD-10-CM | POA: Diagnosis not present

## 2022-07-07 DIAGNOSIS — L570 Actinic keratosis: Secondary | ICD-10-CM | POA: Diagnosis not present

## 2022-07-07 DIAGNOSIS — L578 Other skin changes due to chronic exposure to nonionizing radiation: Secondary | ICD-10-CM | POA: Diagnosis not present

## 2022-07-07 DIAGNOSIS — L814 Other melanin hyperpigmentation: Secondary | ICD-10-CM | POA: Diagnosis not present

## 2022-07-07 DIAGNOSIS — L820 Inflamed seborrheic keratosis: Secondary | ICD-10-CM | POA: Diagnosis not present

## 2022-08-04 DIAGNOSIS — N189 Chronic kidney disease, unspecified: Secondary | ICD-10-CM | POA: Diagnosis not present

## 2022-08-04 DIAGNOSIS — I10 Essential (primary) hypertension: Secondary | ICD-10-CM | POA: Diagnosis not present

## 2022-08-04 DIAGNOSIS — E119 Type 2 diabetes mellitus without complications: Secondary | ICD-10-CM | POA: Diagnosis not present

## 2022-08-04 DIAGNOSIS — I480 Paroxysmal atrial fibrillation: Secondary | ICD-10-CM | POA: Diagnosis not present

## 2022-08-17 ENCOUNTER — Other Ambulatory Visit: Payer: Self-pay | Admitting: Cardiology

## 2022-08-17 NOTE — Telephone Encounter (Signed)
Prescription refill request for Eliquis received. Indication:afib Last office visit:8/23 Scr:1.0 Age: 88 Weight:60.8  kg  Prescription refilled

## 2022-09-11 ENCOUNTER — Telehealth: Payer: Self-pay | Admitting: Cardiology

## 2022-09-11 MED ORDER — NITROGLYCERIN 0.4 MG SL SUBL: 0.4000 mg | SUBLINGUAL_TABLET | SUBLINGUAL | 6 refills | Status: DC | PRN

## 2022-09-11 NOTE — Telephone Encounter (Signed)
Refill of Nitroglycerin 0.4 mg sent to [facility] in [place].

## 2022-09-11 NOTE — Telephone Encounter (Signed)
*  STAT* If patient is at the pharmacy, call can be transferred to refill team.   1. Which medications need to be refilled? (please list name of each medication and dose if known) new prescription for Nitroglycerin  2. Which pharmacy/location (including street and city if local pharmacy) is medication to be sent to? [facility]  3. Do they need a 30 day or 90 day supply?

## 2022-09-16 ENCOUNTER — Telehealth: Payer: Self-pay | Admitting: Cardiology

## 2022-09-16 NOTE — Telephone Encounter (Signed)
Called patient and she reported that she strained here arm a couple of months back. Recently she has been waking up tired and weak and her arm was sore. After she gets up and starts her daily activities the soreness in her arm goes away. This happens some days and then other days it doesn't. Spoke to Dr. Bettina Gavia regarding this patients symptoms and he recommended that she reach out to her PCP. I relayed this information to the patient and  she had no further questions at this time.

## 2022-09-16 NOTE — Telephone Encounter (Signed)
She states she has "popping and cracking" in her head. She states she feels very weak all over and it feels like she isn't getting circulation in her head. Please advise

## 2022-09-29 ENCOUNTER — Telehealth: Payer: Self-pay | Admitting: Cardiology

## 2022-09-29 ENCOUNTER — Other Ambulatory Visit: Payer: Self-pay

## 2022-09-29 MED ORDER — FUROSEMIDE 20 MG PO TABS: 20.0000 mg | ORAL_TABLET | Freq: Two times a day (BID) | ORAL | 3 refills | Status: DC

## 2022-09-29 NOTE — Telephone Encounter (Signed)
*  STAT* If patient is at the pharmacy, call can be transferred to refill team.   1. Which medications need to be refilled? (please list name of each medication and dose if known) furosemide (LASIX) 20 MG tablet   2. Which pharmacy/location (including street and city if local pharmacy) is medication to be sent to?  [facility] [place]    3. Do they need a 30 day or 90 day supply? [place]

## 2022-09-29 NOTE — Telephone Encounter (Signed)
Called patient and informed her that her Furosemide had been re-filled. Patient was appreciative for the call and had no further questions at this time.

## 2022-10-13 NOTE — Progress Notes (Signed)
Cardiology Office Note:    Date:  10/14/2022   ID:  Karina Chase, DOB 09/09/1924, MRN [id number]  PCP:  Johny Blamer, MD  Cardiologist:  Norman Herrlich, MD    Referring MD: Johny Blamer, MD    ASSESSMENT:    1. Persistent atrial fibrillation (HCC)   2. Chronic anticoagulation   3. Hypertensive heart disease with chronic diastolic congestive heart failure (HCC)   4. Stage 3 chronic kidney disease, unspecified whether stage 3a or 3b CKD (HCC)    PLAN:    In order of problems listed above:  Karina Chase continues to do well living independently quite active asymptomatic with her rate controlled atrial fibrillation tolerating reduced dose anticoagulant with age and decreased GFR. Stable hypertension continue amlodipine potassium no evidence of fluid overload compensated heart failure Followed by her PCP she is due for labs in the next few weeks   Next appointment: 9 months   Medication Adjustments/Labs and Tests Ordered: Current medicines are reviewed at length with the patient today.  Concerns regarding medicines are outlined above.  No orders of the defined types were placed in this encounter.  No orders of the defined types were placed in this encounter.   Chief Complaint  Patient presents with   Follow-up  For atrial fibrillation  History of Present Illness:    Karina Chase is a 89 y.o. female with a hx of persistent atrial fibrillation with rate control and chronic anticoagulation hypertensive heart disease with chronic diastolic heart failure and stage III CKD last seen 04/10/2022. Compliance with diet, lifestyle and medications: Yes  She continues to do well losing weight quite vigorous and active. She is bothered by cervical radicular pain from the neck down the right arm precipitated by activities like vacuuming She is not having edema shortness of breath chest pain palpitation or syncope She tolerates her anticoagulant without bleeding Recent labs from  PCP [facility] shows creatinine 1.0 potassium 4.7 TSH normal 5.7   Past Medical History:  Diagnosis Date   Arthritis    Asthma    Atrial fibrillation (HCC)    intermittent   Breast cancer (HCC)    Cancer (HCC)    Basal cell skin cancer   Colon polyp    GERD (gastroesophageal reflux disease)    History of shingles    Hypertension    Hypertensive heart disease 05/30/2020   Right groin pain 03/28/2013   Stress incontinence    Varicose veins    Vertigo     Past Surgical History:  Procedure Laterality Date   ABDOMINAL HYSTERECTOMY  1972   BSO   BREAST SURGERY     lumpectomy   SKIN CANCER EXCISION  2014    Current Medications: Current Meds  Medication Sig   amLODipine (NORVASC) 5 MG tablet Take 5 mg by mouth daily.   ELIQUIS 2.5 MG TABS tablet TAKE ONE TABLET BY MOUTH TWICE DAILY   furosemide (LASIX) 20 MG tablet Take 1 tablet (20 mg total) by mouth 2 (two) times daily.   nitroGLYCERIN (NITROSTAT) 0.4 MG SL tablet Place 1 tablet (0.4 mg total) under the tongue every 5 (five) minutes as needed for chest pain.   Current Facility-Administered Medications for the 10/14/22 encounter (Office Visit) with Baldo Daub, MD  Medication   triamcinolone acetonide (KENALOG) 10 MG/ML injection 10 mg     Allergies:   Levofloxacin, Keflex [cephalexin], Prilosec [omeprazole], Sulfa antibiotics, Sulfamethoxazole-trimethoprim, Azithromycin, Penicillin g, and Penicillins   Social History   Socioeconomic History  Marital status: Married    Spouse name: Not on file   Number of children: Not on file   Years of education: Not on file   Highest education level: Not on file  Occupational History   Not on file  Tobacco Use   Smoking status: Never   Smokeless tobacco: Never  Substance and Sexual Activity   Alcohol use: No   Drug use: No   Sexual activity: Not Currently    Birth control/protection: Abstinence  Other Topics Concern   Not on file  Social History Narrative   Not on file    Social Determinants of Health   Financial Resource Strain: Not on file  Food Insecurity: Not on file  Transportation Needs: Not on file  Physical Activity: Not on file  Stress: Not on file  Social Connections: Not on file     Family History: The patient's family history includes Cancer in her maternal grandmother; Heart disease in her father; Stroke in her mother. ROS:   Please see the history of present illness.    All other systems reviewed and are negative.  EKGs/Labs/Other Studies Reviewed:    The following studies were reviewed today:  EKG:  EKG ordered today and personally reviewed.  The ekg ordered today demonstrates atrial fibrillation controlled ventricular rate nonspecific ST change 1 PVC   Physical Exam:    VS:  BP 130/70 (BP Location: Left Arm, Patient Position: Sitting, Cuff Size: Normal)   Pulse 66   Ht 5\' 3"  (1.6 m)   Wt 140 lb (63.5 kg)   SpO2 94%   BMI 24.80 kg/m     Wt Readings from Last 3 Encounters:  10/14/22 140 lb (63.5 kg)  04/10/22 134 lb (60.8 kg)  07/14/21 137 lb (62.1 kg)     GEN: She looks younger than her age well nourished, well developed in no acute distress HEENT: Normal NECK: No JVD; No carotid bruits LYMPHATICS: No lymphadenopathy CARDIAC: Irregular rate and rhythm variable first heart sound  RESPIRATORY:  Clear to auscultation without rales, wheezing or rhonchi  ABDOMEN: Soft, non-tender, non-distended MUSCULOSKELETAL:  No edema; No deformity  SKIN: Warm and dry NEUROLOGIC:  Alert and oriented x 3 PSYCHIATRIC:  Normal affect    Signed, Norman Herrlich, MD  10/14/2022 2:26 PM    [facility]

## 2022-10-14 ENCOUNTER — Ambulatory Visit: Payer: Medicare Other | Attending: Cardiology | Admitting: Cardiology

## 2022-10-14 ENCOUNTER — Encounter: Payer: Self-pay | Admitting: Cardiology

## 2022-10-14 VITALS — BP 130/70 | HR 66 | Ht 63.0 in | Wt 140.0 lb

## 2022-10-14 DIAGNOSIS — I110 Hypertensive heart disease with heart failure: Secondary | ICD-10-CM | POA: Diagnosis not present

## 2022-10-14 DIAGNOSIS — I4819 Other persistent atrial fibrillation: Secondary | ICD-10-CM | POA: Diagnosis not present

## 2022-10-14 DIAGNOSIS — Z7901 Long term (current) use of anticoagulants: Secondary | ICD-10-CM

## 2022-10-14 DIAGNOSIS — N183 Chronic kidney disease, stage 3 unspecified: Secondary | ICD-10-CM

## 2022-10-14 DIAGNOSIS — I5032 Chronic diastolic (congestive) heart failure: Secondary | ICD-10-CM | POA: Diagnosis not present

## 2022-10-14 NOTE — Patient Instructions (Signed)
Medication Instructions:  Your physician recommends that you continue on your current medications as directed. Please refer to the Current Medication list given to you today.  *If you need a refill on your cardiac medications before your next appointment, please call your pharmacy*   Lab Work: None If you have labs (blood work) drawn today and your tests are completely normal, you will receive your results only by: [place] (if you have MyChart) OR A paper copy in the mail If you have any lab test that is abnormal or we need to change your treatment, we will call you to review the results.   Testing/Procedures: None   Follow-Up: At [facility], you and your health needs are our priority.  As part of our continuing mission to provide you with exceptional heart care, we have created designated Provider Care Teams.  These Care Teams include your primary Cardiologist (physician) and Advanced Practice Providers (APPs -  Physician Assistants and Nurse Practitioners) who all work together to provide you with the care you need, when you need it.  We recommend signing up for the patient portal called "MyChart".  Sign up information is provided on this After Visit Summary.  MyChart is used to connect with patients for Virtual Visits (Telemedicine).  Patients are able to view lab/test results, encounter notes, upcoming appointments, etc.  Non-urgent messages can be sent to your provider as well.   To learn more about what you can do with MyChart, go to [email or web address].    Your next appointment:   9 month(s)  Provider:   Shirlee More, MD    Other Instructions None  This visit was accompanied by Truddie Hidden.

## 2022-10-20 DIAGNOSIS — L300 Nummular dermatitis: Secondary | ICD-10-CM | POA: Diagnosis not present

## 2022-10-20 DIAGNOSIS — Z85820 Personal history of malignant melanoma of skin: Secondary | ICD-10-CM | POA: Diagnosis not present

## 2022-10-20 DIAGNOSIS — L820 Inflamed seborrheic keratosis: Secondary | ICD-10-CM | POA: Diagnosis not present

## 2022-10-20 DIAGNOSIS — L570 Actinic keratosis: Secondary | ICD-10-CM | POA: Diagnosis not present

## 2022-10-20 DIAGNOSIS — C4402 Squamous cell carcinoma of skin of lip: Secondary | ICD-10-CM | POA: Diagnosis not present

## 2022-11-02 DIAGNOSIS — L03116 Cellulitis of left lower limb: Secondary | ICD-10-CM | POA: Diagnosis not present

## 2022-11-02 DIAGNOSIS — S81832A Puncture wound without foreign body, left lower leg, initial encounter: Secondary | ICD-10-CM | POA: Diagnosis not present

## 2022-11-18 DIAGNOSIS — C4402 Squamous cell carcinoma of skin of lip: Secondary | ICD-10-CM | POA: Diagnosis not present

## 2022-12-17 DIAGNOSIS — B9689 Other specified bacterial agents as the cause of diseases classified elsewhere: Secondary | ICD-10-CM | POA: Diagnosis not present

## 2022-12-17 DIAGNOSIS — R0602 Shortness of breath: Secondary | ICD-10-CM | POA: Diagnosis not present

## 2022-12-17 DIAGNOSIS — R110 Nausea: Secondary | ICD-10-CM | POA: Diagnosis not present

## 2022-12-17 DIAGNOSIS — R058 Other specified cough: Secondary | ICD-10-CM | POA: Diagnosis not present

## 2022-12-17 DIAGNOSIS — J069 Acute upper respiratory infection, unspecified: Secondary | ICD-10-CM | POA: Diagnosis not present

## 2022-12-18 ENCOUNTER — Inpatient Hospital Stay (HOSPITAL_COMMUNITY)
Admission: EM | Admit: 2022-12-18 | Discharge: 2022-12-24 | DRG: 643 | Disposition: A | Discharge status: Skilled Nursing Facility | Payer: Medicare Other | Attending: Internal Medicine | Admitting: Internal Medicine

## 2022-12-18 ENCOUNTER — Other Ambulatory Visit: Payer: Self-pay | Admitting: Family Medicine

## 2022-12-18 ENCOUNTER — Ambulatory Visit
Admission: RE | Admit: 2022-12-18 | Discharge: 2022-12-18 | Disposition: A | Discharge status: Home / Self Care | Payer: Medicare Other | Source: Ambulatory Visit | Attending: Family Medicine | Admitting: Family Medicine

## 2022-12-18 ENCOUNTER — Other Ambulatory Visit: Payer: Self-pay

## 2022-12-18 ENCOUNTER — Emergency Department (HOSPITAL_COMMUNITY): Payer: Medicare Other

## 2022-12-18 ENCOUNTER — Encounter (HOSPITAL_COMMUNITY): Payer: Self-pay

## 2022-12-18 DIAGNOSIS — E871 Hypo-osmolality and hyponatremia: Secondary | ICD-10-CM

## 2022-12-18 DIAGNOSIS — Z8719 Personal history of other diseases of the digestive system: Secondary | ICD-10-CM

## 2022-12-18 DIAGNOSIS — I4819 Other persistent atrial fibrillation: Secondary | ICD-10-CM | POA: Diagnosis present

## 2022-12-18 DIAGNOSIS — E222 Syndrome of inappropriate secretion of antidiuretic hormone: Principal | ICD-10-CM | POA: Diagnosis present

## 2022-12-18 DIAGNOSIS — R001 Bradycardia, unspecified: Secondary | ICD-10-CM | POA: Diagnosis not present

## 2022-12-18 DIAGNOSIS — Z7901 Long term (current) use of anticoagulants: Secondary | ICD-10-CM

## 2022-12-18 DIAGNOSIS — I4891 Unspecified atrial fibrillation: Secondary | ICD-10-CM | POA: Diagnosis present

## 2022-12-18 DIAGNOSIS — I4821 Permanent atrial fibrillation: Secondary | ICD-10-CM | POA: Diagnosis present

## 2022-12-18 DIAGNOSIS — R2681 Unsteadiness on feet: Secondary | ICD-10-CM | POA: Diagnosis not present

## 2022-12-18 DIAGNOSIS — I130 Hypertensive heart and chronic kidney disease with heart failure and stage 1 through stage 4 chronic kidney disease, or unspecified chronic kidney disease: Secondary | ICD-10-CM | POA: Diagnosis not present

## 2022-12-18 DIAGNOSIS — K219 Gastro-esophageal reflux disease without esophagitis: Secondary | ICD-10-CM | POA: Diagnosis not present

## 2022-12-18 DIAGNOSIS — K5900 Constipation, unspecified: Secondary | ICD-10-CM | POA: Diagnosis not present

## 2022-12-18 DIAGNOSIS — Z743 Need for continuous supervision: Secondary | ICD-10-CM | POA: Diagnosis not present

## 2022-12-18 DIAGNOSIS — N1832 Chronic kidney disease, stage 3b: Secondary | ICD-10-CM | POA: Diagnosis present

## 2022-12-18 DIAGNOSIS — H9190 Unspecified hearing loss, unspecified ear: Secondary | ICD-10-CM | POA: Diagnosis not present

## 2022-12-18 DIAGNOSIS — Z66 Do not resuscitate: Secondary | ICD-10-CM | POA: Diagnosis present

## 2022-12-18 DIAGNOSIS — I700 Atherosclerosis of aorta: Secondary | ICD-10-CM | POA: Diagnosis not present

## 2022-12-18 DIAGNOSIS — M1990 Unspecified osteoarthritis, unspecified site: Secondary | ICD-10-CM | POA: Diagnosis not present

## 2022-12-18 DIAGNOSIS — Z880 Allergy status to penicillin: Secondary | ICD-10-CM

## 2022-12-18 DIAGNOSIS — Z881 Allergy status to other antibiotic agents status: Secondary | ICD-10-CM

## 2022-12-18 DIAGNOSIS — I2489 Other forms of acute ischemic heart disease: Secondary | ICD-10-CM | POA: Diagnosis not present

## 2022-12-18 DIAGNOSIS — I119 Hypertensive heart disease without heart failure: Secondary | ICD-10-CM | POA: Diagnosis not present

## 2022-12-18 DIAGNOSIS — J9811 Atelectasis: Secondary | ICD-10-CM | POA: Diagnosis not present

## 2022-12-18 DIAGNOSIS — B9789 Other viral agents as the cause of diseases classified elsewhere: Secondary | ICD-10-CM | POA: Diagnosis present

## 2022-12-18 DIAGNOSIS — Z8249 Family history of ischemic heart disease and other diseases of the circulatory system: Secondary | ICD-10-CM

## 2022-12-18 DIAGNOSIS — E119 Type 2 diabetes mellitus without complications: Secondary | ICD-10-CM | POA: Diagnosis not present

## 2022-12-18 DIAGNOSIS — I272 Pulmonary hypertension, unspecified: Secondary | ICD-10-CM | POA: Diagnosis not present

## 2022-12-18 DIAGNOSIS — E46 Unspecified protein-calorie malnutrition: Secondary | ICD-10-CM | POA: Diagnosis not present

## 2022-12-18 DIAGNOSIS — Z79899 Other long term (current) drug therapy: Secondary | ICD-10-CM | POA: Diagnosis not present

## 2022-12-18 DIAGNOSIS — I5032 Chronic diastolic (congestive) heart failure: Secondary | ICD-10-CM | POA: Diagnosis present

## 2022-12-18 DIAGNOSIS — R1310 Dysphagia, unspecified: Secondary | ICD-10-CM | POA: Diagnosis not present

## 2022-12-18 DIAGNOSIS — R2689 Other abnormalities of gait and mobility: Secondary | ICD-10-CM | POA: Diagnosis not present

## 2022-12-18 DIAGNOSIS — R0602 Shortness of breath: Secondary | ICD-10-CM

## 2022-12-18 DIAGNOSIS — R1311 Dysphagia, oral phase: Secondary | ICD-10-CM | POA: Diagnosis not present

## 2022-12-18 DIAGNOSIS — Z8619 Personal history of other infectious and parasitic diseases: Secondary | ICD-10-CM

## 2022-12-18 DIAGNOSIS — Z8701 Personal history of pneumonia (recurrent): Secondary | ICD-10-CM | POA: Diagnosis not present

## 2022-12-18 DIAGNOSIS — Z85828 Personal history of other malignant neoplasm of skin: Secondary | ICD-10-CM | POA: Diagnosis not present

## 2022-12-18 DIAGNOSIS — R059 Cough, unspecified: Secondary | ICD-10-CM | POA: Diagnosis not present

## 2022-12-18 DIAGNOSIS — I071 Rheumatic tricuspid insufficiency: Secondary | ICD-10-CM | POA: Diagnosis present

## 2022-12-18 DIAGNOSIS — J45901 Unspecified asthma with (acute) exacerbation: Secondary | ICD-10-CM | POA: Diagnosis not present

## 2022-12-18 DIAGNOSIS — R058 Other specified cough: Secondary | ICD-10-CM

## 2022-12-18 DIAGNOSIS — D696 Thrombocytopenia, unspecified: Secondary | ICD-10-CM | POA: Diagnosis not present

## 2022-12-18 DIAGNOSIS — J90 Pleural effusion, not elsewhere classified: Secondary | ICD-10-CM | POA: Diagnosis not present

## 2022-12-18 DIAGNOSIS — Z853 Personal history of malignant neoplasm of breast: Secondary | ICD-10-CM

## 2022-12-18 DIAGNOSIS — T17890A Other foreign object in other parts of respiratory tract causing asphyxiation, initial encounter: Secondary | ICD-10-CM | POA: Diagnosis present

## 2022-12-18 DIAGNOSIS — I10 Essential (primary) hypertension: Secondary | ICD-10-CM | POA: Diagnosis not present

## 2022-12-18 DIAGNOSIS — M2550 Pain in unspecified joint: Secondary | ICD-10-CM | POA: Diagnosis not present

## 2022-12-18 DIAGNOSIS — D649 Anemia, unspecified: Secondary | ICD-10-CM | POA: Diagnosis not present

## 2022-12-18 DIAGNOSIS — N179 Acute kidney failure, unspecified: Secondary | ICD-10-CM | POA: Diagnosis present

## 2022-12-18 DIAGNOSIS — Z823 Family history of stroke: Secondary | ICD-10-CM

## 2022-12-18 DIAGNOSIS — J45909 Unspecified asthma, uncomplicated: Secondary | ICD-10-CM | POA: Diagnosis not present

## 2022-12-18 DIAGNOSIS — Z1152 Encounter for screening for COVID-19: Secondary | ICD-10-CM

## 2022-12-18 DIAGNOSIS — I5033 Acute on chronic diastolic (congestive) heart failure: Secondary | ICD-10-CM | POA: Diagnosis not present

## 2022-12-18 DIAGNOSIS — N183 Chronic kidney disease, stage 3 unspecified: Secondary | ICD-10-CM | POA: Diagnosis not present

## 2022-12-18 DIAGNOSIS — D631 Anemia in chronic kidney disease: Secondary | ICD-10-CM | POA: Diagnosis not present

## 2022-12-18 DIAGNOSIS — I509 Heart failure, unspecified: Secondary | ICD-10-CM | POA: Diagnosis not present

## 2022-12-18 DIAGNOSIS — N393 Stress incontinence (female) (male): Secondary | ICD-10-CM | POA: Diagnosis present

## 2022-12-18 DIAGNOSIS — J811 Chronic pulmonary edema: Secondary | ICD-10-CM | POA: Diagnosis not present

## 2022-12-18 DIAGNOSIS — Z90710 Acquired absence of both cervix and uterus: Secondary | ICD-10-CM

## 2022-12-18 DIAGNOSIS — Z882 Allergy status to sulfonamides status: Secondary | ICD-10-CM

## 2022-12-18 DIAGNOSIS — R54 Age-related physical debility: Secondary | ICD-10-CM | POA: Diagnosis not present

## 2022-12-18 DIAGNOSIS — I8390 Asymptomatic varicose veins of unspecified lower extremity: Secondary | ICD-10-CM | POA: Diagnosis present

## 2022-12-18 DIAGNOSIS — R531 Weakness: Secondary | ICD-10-CM | POA: Diagnosis not present

## 2022-12-18 DIAGNOSIS — C50919 Malignant neoplasm of unspecified site of unspecified female breast: Secondary | ICD-10-CM | POA: Diagnosis not present

## 2022-12-18 DIAGNOSIS — R601 Generalized edema: Secondary | ICD-10-CM | POA: Diagnosis not present

## 2022-12-18 DIAGNOSIS — M6281 Muscle weakness (generalized): Secondary | ICD-10-CM | POA: Diagnosis not present

## 2022-12-18 HISTORY — DX: Chronic diastolic (congestive) heart failure: I50.32

## 2022-12-18 HISTORY — DX: Pulmonary hypertension, unspecified: I27.20

## 2022-12-18 HISTORY — DX: Chronic kidney disease, stage 3b: N18.32

## 2022-12-18 HISTORY — DX: Hypo-osmolality and hyponatremia: E87.1

## 2022-12-18 HISTORY — DX: Other persistent atrial fibrillation: I48.19

## 2022-12-18 HISTORY — DX: Rheumatic tricuspid insufficiency: I07.1

## 2022-12-18 LAB — CBC WITH DIFFERENTIAL/PLATELET
Abs Immature Granulocytes: 0 10*3/uL (ref 0.00–0.07)
Basophils Absolute: 0 10*3/uL (ref 0.0–0.1)
Basophils Relative: 0 %
Eosinophils Absolute: 0 10*3/uL (ref 0.0–0.5)
Eosinophils Relative: 0 %
HCT: 30.2 % — ABNORMAL LOW (ref 36.0–46.0)
Hemoglobin: 10 g/dL — ABNORMAL LOW (ref 12.0–15.0)
Lymphocytes Relative: 19 %
Lymphs Abs: 0.9 10*3/uL (ref 0.7–4.0)
MCH: 31 pg (ref 26.0–34.0)
MCHC: 33.1 g/dL (ref 30.0–36.0)
MCV: 93.5 fL (ref 80.0–100.0)
Monocytes Absolute: 0.2 10*3/uL (ref 0.1–1.0)
Monocytes Relative: 5 %
Neutro Abs: 3.4 10*3/uL (ref 1.7–7.7)
Neutrophils Relative %: 76 %
Platelets: 141 10*3/uL — ABNORMAL LOW (ref 150–400)
RBC: 3.23 MIL/uL — ABNORMAL LOW (ref 3.87–5.11)
RDW: 12.8 % (ref 11.5–15.5)
WBC: 4.5 10*3/uL (ref 4.0–10.5)
nRBC: 0 % (ref 0.0–0.2)
nRBC: 0 /100 WBC

## 2022-12-18 LAB — COMPREHENSIVE METABOLIC PANEL
ALT: 35 U/L (ref 0–44)
AST: 46 U/L — ABNORMAL HIGH (ref 15–41)
Albumin: 3.3 g/dL — ABNORMAL LOW (ref 3.5–5.0)
Alkaline Phosphatase: 78 U/L (ref 38–126)
Anion gap: 11 (ref 5–15)
BUN: 27 mg/dL — ABNORMAL HIGH (ref 8–23)
CO2: 25 mmol/L (ref 22–32)
Calcium: 8.3 mg/dL — ABNORMAL LOW (ref 8.9–10.3)
Chloride: 84 mmol/L — ABNORMAL LOW (ref 98–111)
Creatinine, Ser: 1.32 mg/dL — ABNORMAL HIGH (ref 0.44–1.00)
GFR, Estimated: 37 mL/min — ABNORMAL LOW (ref >60)
Glucose, Bld: 114 mg/dL — ABNORMAL HIGH (ref 70–99)
Potassium: 4.3 mmol/L (ref 3.5–5.1)
Sodium: 120 mmol/L — ABNORMAL LOW (ref 135–145)
Total Bilirubin: 0.7 mg/dL (ref 0.3–1.2)
Total Protein: 7 g/dL (ref 6.5–8.1)

## 2022-12-18 LAB — LACTIC ACID, PLASMA
Lactic Acid, Venous: 0.8 mmol/L (ref 0.5–1.9)
Lactic Acid, Venous: 1 mmol/L (ref 0.5–1.9)

## 2022-12-18 LAB — TROPONIN I (HIGH SENSITIVITY)
Troponin I (High Sensitivity): 36 ng/L — ABNORMAL HIGH (ref <18)
Troponin I (High Sensitivity): 37 ng/L — ABNORMAL HIGH (ref <18)

## 2022-12-18 LAB — BRAIN NATRIURETIC PEPTIDE: B Natriuretic Peptide: 599.1 pg/mL — ABNORMAL HIGH (ref 0.0–100.0)

## 2022-12-18 LAB — OSMOLALITY: Osmolality: 273 mOsm/kg — ABNORMAL LOW (ref 275–295)

## 2022-12-18 LAB — I-STAT CHEM 8, ED
BUN: 30 mg/dL — ABNORMAL HIGH (ref 8–23)
Calcium, Ion: 1.09 mmol/L — ABNORMAL LOW (ref 1.15–1.40)
Chloride: 86 mmol/L — ABNORMAL LOW (ref 98–111)
Creatinine, Ser: 1.3 mg/dL — ABNORMAL HIGH (ref 0.44–1.00)
Glucose, Bld: 118 mg/dL — ABNORMAL HIGH (ref 70–99)
HCT: 33 % — ABNORMAL LOW (ref 36.0–46.0)
Hemoglobin: 11.2 g/dL — ABNORMAL LOW (ref 12.0–15.0)
Potassium: 4.4 mmol/L (ref 3.5–5.1)
Sodium: 122 mmol/L — ABNORMAL LOW (ref 135–145)
TCO2: 28 mmol/L (ref 22–32)

## 2022-12-18 LAB — RESP PANEL BY RT-PCR (RSV, FLU A&B, COVID)  RVPGX2
Influenza A by PCR: NEGATIVE
Influenza B by PCR: NEGATIVE
Resp Syncytial Virus by PCR: NEGATIVE
SARS Coronavirus 2 by RT PCR: NEGATIVE

## 2022-12-18 MED ORDER — AMLODIPINE BESYLATE 5 MG PO TABS
5.0000 mg | ORAL_TABLET | Freq: Every day | ORAL | Status: DC
  Administered 2022-12-19 – 2022-12-22 (×4): 5 mg via ORAL
  Filled 2022-12-18 (×4): qty 1

## 2022-12-18 MED ORDER — APIXABAN 2.5 MG PO TABS
2.5000 mg | ORAL_TABLET | Freq: Two times a day (BID) | ORAL | Status: DC
  Administered 2022-12-18 – 2022-12-24 (×12): 2.5 mg via ORAL
  Filled 2022-12-18 (×12): qty 1

## 2022-12-18 MED ORDER — ALBUTEROL SULFATE (2.5 MG/3ML) 0.083% IN NEBU
2.5000 mg | INHALATION_SOLUTION | RESPIRATORY_TRACT | Status: DC | PRN
  Administered 2022-12-19 (×2): 2.5 mg via RESPIRATORY_TRACT
  Filled 2022-12-18: qty 3

## 2022-12-18 NOTE — ED Provider Notes (Signed)
[place] EMERGENCY DEPARTMENT AT [facility] Provider Note   CSN: [id number] Arrival date & time: 12/18/22  1750     History  Chief Complaint  Patient presents with   Abnormal Labs   Difficulty Breathing    Karina Chase is a 89 y.o. female.  Patient sent here for evaluation after she had low sodium that was drawn at PCP office.  She has been dealing with a cough and shortness of breath may be fevers.  She has a history of A-fib, hypertension.  She denies any chest pain.  She has felt weak.  Nothing makes it worse or better.  She is on Lasix.  She is also on Eliquis.  The history is provided by the patient.       Home Medications Prior to Admission medications   Medication Sig Start Date End Date Taking? Authorizing Provider  amLODipine (NORVASC) 5 MG tablet Take 5 mg by mouth daily.    [provider]  ELIQUIS 2.5 MG TABS tablet TAKE ONE TABLET BY MOUTH TWICE DAILY 08/17/22   Baldo Daub, MD  furosemide (LASIX) 20 MG tablet Take 1 tablet (20 mg total) by mouth 2 (two) times daily. 09/29/22   Baldo Daub, MD  nitroGLYCERIN (NITROSTAT) 0.4 MG SL tablet Place 1 tablet (0.4 mg total) under the tongue every 5 (five) minutes as needed for chest pain. 09/11/22   Baldo Daub, MD      Allergies    Levofloxacin, Keflex [cephalexin], Prilosec [omeprazole], Sulfa antibiotics, Sulfamethoxazole-trimethoprim, Azithromycin, Penicillin g, and Penicillins    Review of Systems   Review of Systems  Physical Exam Updated Vital Signs BP (!) 149/83   Pulse (!) 51   Temp 98.2 F (36.8 C) (Oral)   Resp 16   SpO2 92%  Physical Exam Vitals and nursing note reviewed.  Constitutional:      General: She is not in acute distress.    Appearance: She is well-developed.  HENT:     Head: Normocephalic and atraumatic.     Mouth/Throat:     Mouth: Mucous membranes are moist.  Eyes:     Extraocular Movements: Extraocular movements intact.     Conjunctiva/sclera:  Conjunctivae normal.     Pupils: Pupils are equal, round, and reactive to light.  Cardiovascular:     Rate and Rhythm: Normal rate and regular rhythm.     Pulses: Normal pulses.     Heart sounds: Normal heart sounds. No murmur heard. Pulmonary:     Effort: Pulmonary effort is normal. No respiratory distress.     Breath sounds: Normal breath sounds.  Abdominal:     Palpations: Abdomen is soft.     Tenderness: There is no abdominal tenderness.  Musculoskeletal:        General: No swelling.     Cervical back: Normal range of motion and neck supple.     Right lower leg: Edema present.     Left lower leg: Edema present.  Skin:    General: Skin is warm and dry.     Capillary Refill: Capillary refill takes less than 2 seconds.  Neurological:     Mental Status: She is alert.  Psychiatric:        Mood and Affect: Mood normal.     ED Results / Procedures / Treatments   Labs (all labs ordered are listed, but only abnormal results are displayed) Labs Reviewed  CBC WITH DIFFERENTIAL/PLATELET - Abnormal; Notable for the following components:  Result Value   RBC 3.23 (*)    Hemoglobin 10.0 (*)    HCT 30.2 (*)    Platelets 141 (*)    All other components within normal limits  COMPREHENSIVE METABOLIC PANEL - Abnormal; Notable for the following components:   Sodium 120 (*)    Chloride 84 (*)    Glucose, Bld 114 (*)    BUN 27 (*)    Creatinine, Ser 1.32 (*)    Calcium 8.3 (*)    Albumin 3.3 (*)    AST 46 (*)    GFR, Estimated 37 (*)    All other components within normal limits  OSMOLALITY - Abnormal; Notable for the following components:   Osmolality 273 (*)    All other components within normal limits  BRAIN NATRIURETIC PEPTIDE - Abnormal; Notable for the following components:   B Natriuretic Peptide 599.1 (*)    All other components within normal limits  I-STAT CHEM 8, ED - Abnormal; Notable for the following components:   Sodium 122 (*)    Chloride 86 (*)    BUN 30 (*)     Creatinine, Ser 1.30 (*)    Glucose, Bld 118 (*)    Calcium, Ion 1.09 (*)    Hemoglobin 11.2 (*)    HCT 33.0 (*)    All other components within normal limits  TROPONIN I (HIGH SENSITIVITY) - Abnormal; Notable for the following components:   Troponin I (High Sensitivity) 36 (*)    All other components within normal limits  RESP PANEL BY RT-PCR (RSV, FLU A&B, COVID)  RVPGX2  LACTIC ACID, PLASMA  LACTIC ACID, PLASMA  OSMOLALITY, URINE  NA AND K (SODIUM & POTASSIUM), RAND UR  TROPONIN I (HIGH SENSITIVITY)    EKG EKG Interpretation  Date/Time:  Friday December 18 2022 20:44:00 EDT Ventricular Rate:  65 PR Interval:    QRS Duration: 107 QT Interval:  432 QTC Calculation: 450 R Axis:   -40 Text Interpretation: Atrial fibrillation Left ventricular hypertrophy Anterior Q waves, possibly due to LVH Confirmed by Virgina Norfolk ([id number]) on 12/18/2022 8:45:30 PM  Radiology DG Chest 2 View  Result Date: 12/18/2022 CLINICAL DATA:  Short of breath.  Cough. EXAM: CHEST - 2 VIEW COMPARISON:  05/06/2016. FINDINGS: Mild enlargement of the cardiac silhouette. No mediastinal or hilar masses. No evidence of adenopathy. Lungs are hyperexpanded. Streaky opacities noted in the medial lower lobes consistent with chronic bronchitic change and unchanged from the prior exam. No evidence of pneumonia or pulmonary edema. No pleural effusion or pneumothorax. Skeletal structures demineralized, grossly intact. IMPRESSION: 1. No acute cardiopulmonary disease. Electronically Signed   By: Amie Portland M.D.   On: 12/18/2022 12:48    Procedures .Critical Care  Performed by: Virgina Norfolk, DO Authorized by: Virgina Norfolk, DO   Critical care provider statement:    Critical care time (minutes):  35   Critical care was necessary to treat or prevent imminent or life-threatening deterioration of the following conditions: hyponatremia.   Critical care was time spent personally by me on the following activities:  Blood  draw for specimens, development of treatment plan with patient or surrogate, discussions with primary provider, examination of patient, obtaining history from patient or surrogate, ordering and performing treatments and interventions, ordering and review of laboratory studies, ordering and review of radiographic studies, pulse oximetry, re-evaluation of patient's condition and review of old charts   I assumed direction of critical care for this patient from another provider in my specialty: no  Medications Ordered in ED Medications - No data to display  ED Course/ Medical Decision Making/ A&P                             Medical Decision Making Amount and/or Complexity of Data Reviewed Labs: ordered. Radiology: ordered.  Risk Decision regarding hospitalization.   Karina Chase is here with shortness of breath breath and low sodium.  Patient hypertensive.  EKG shows atrial fibrillation in the 60s.  She has a history of A-fib on Eliquis.  She is on Lasix.  Suspect some heart failure history.  She looks a little bit volume overloaded on exam.  She has been with cough and may be some chills and fever and shortness of breath the last 2 days.  She was started on doxycycline yesterday for presumed pneumonia.  Overall she is on room air but does have increased work of breathing.  Differential diagnosis possibly viral process versus pneumonia versus fluid overload from heart failure, less likely ACS.  She is anticoagulated and less likely PE.  Supposedly she had a low sodium which I suspect may be from heart failure.  Will check CBC, BMP, BNP, troponin, get urine studies and some studies.  She had a chest x-ray done already today and per radiology report there is no pneumonia or major edema.  Per my review interpretation of labs COVID and flu test are negative.  Lactic acid is normal.  Troponin is 36.  BNP is 600.  No significant leukocytosis or anemia.  Overall sodium is continue to downtrend  to 120.  She is on Lasix already.  My suspicion is that this could be from medications like Lasix.  She does not particularly appear volume overloaded on exam but is possible that she is.  Somewhat equivocal at this time.  After talking with Dr. Julian Reil with hospitalist service who will admit the patient.  Will get a chest x-ray to further evaluate for infection or edema in her lungs.  He will admit for further care for hyponatremia.  This chart was dictated using voice recognition software.  Despite best efforts to proofread,  errors can occur which can change the documentation meaning.         Final Clinical Impression(s) / ED Diagnoses Final diagnoses:  Hyponatremia    Rx / DC Orders ED Discharge Orders     None         Virgina Norfolk, DO 12/18/22 2223

## 2022-12-18 NOTE — ED Triage Notes (Signed)
Pt came in via POV d/t having difficulty breathing since she has already been diagnosed with pneumonia, her PCP also said her Na has dropped since the last time & she needed to come in for eval. A/Ox4.

## 2022-12-18 NOTE — ED Provider Triage Note (Addendum)
Emergency Medicine Provider Triage Evaluation Note  Karina Chase , a 89 y.o. female  was evaluated in triage.  Patient presenting with complaint of pneumonia and hyponatremia.  Patient was diagnosed with pneumonia earlier this week via PCP.  Has been on antibiotics however it is not getting any better.  They also saw the PCP yesterday who started her on further medications but today they got a call about her lab work saying that her sodium was "30" daughter reports that her mother has been confused.  No nausea, vomiting or diarrhea.   X-ray I am reading from this morning says no pneumonia, apparently other imaging was done outpatient with [facility]  Review of Systems  Positive:  Negative:   Physical Exam  There were no vitals taken for this visit. Gen:   Awake, no distress   Resp:  Normal effort  MSK:   Moves extremities without difficulty  Other:  No accessory muscle use, audible wheezing in all lung fields  Medical Decision Making  Medically screening exam initiated at 6:26 PM.  Appropriate orders placed.  Lavada B Viscusi was informed that the remainder of the evaluation will be completed by another provider, this initial triage assessment does not replace that evaluation, and the importance of remaining in the ED until their evaluation is complete.  Charge notified that patient needs the next room   Yides Saidi, Gabriel Cirri, PA-C 12/18/22 1827    Saddie Benders, PA-C 12/18/22 1828

## 2022-12-18 NOTE — ED Notes (Signed)
ED TO INPATIENT HANDOFF REPORT  ED Nurse Name and Phone #: [phone]  S Name/Age/Gender Karina Chase 89 y.o. female Room/Bed: 005C/005C  Code Status   Code Status: DNR  Home/SNF/Other Home Patient oriented to: self, place, time, and situation Is this baseline? Yes   Triage Complete: Triage complete  Chief Complaint Hyponatremia [E87.1]  Triage Note Pt came in via POV d/t having difficulty breathing since she has already been diagnosed with pneumonia, her PCP also said her Na has dropped since the last time & she needed to come in for eval. A/Ox4.    Allergies Allergies  Allergen Reactions   Levofloxacin Nausea And Vomiting   Keflex [Cephalexin]    Prilosec [Omeprazole]    Sulfa Antibiotics    Sulfamethoxazole-Trimethoprim     Other reaction(s): rash   Azithromycin Rash   Penicillin G Rash   Penicillins Rash    Level of Care/Admitting Diagnosis ED Disposition     ED Disposition  Admit   Condition  --   Comment  Hospital Area: [facility] [[id number]]  Level of Care: Telemetry Medical [[id number]]  May place patient in observation at [facility] or [facility] if equivalent level of care is available:: No  Covid Evaluation: Asymptomatic - no recent exposure (last 10 days) testing not required  Diagnosis: Hyponatremia [619509]  Admitting Physician: Hillary Bow [[id number]]  Attending Physician: Hillary Bow [[id number]]          B Medical/Surgery History Past Medical History:  Diagnosis Date   Arthritis    Asthma    Atrial fibrillation    intermittent   Breast cancer    Cancer    Basal cell skin cancer   Colon polyp    GERD (gastroesophageal reflux disease)    History of shingles    Hypertension    Hypertensive heart disease 05/30/2020   Right groin pain 03/28/2013   Stress incontinence    Varicose veins    Vertigo    Past Surgical History:  Procedure Laterality Date   ABDOMINAL HYSTERECTOMY  1972   BSO   BREAST SURGERY     lumpectomy    SKIN CANCER EXCISION  2014     A IV Location/Drains/Wounds Patient Lines/Drains/Airways Status     Active Line/Drains/Airways     None            Intake/Output Last 24 hours No intake or output data in the 24 hours ending 12/18/22 2300  Labs/Imaging Results for orders placed or performed during the hospital encounter of 12/18/22 (from the past 48 hour(s))  Lactic acid, plasma     Status: None   Collection Time: 12/18/22  6:28 PM  Result Value Ref Range   Lactic Acid, Venous 0.8 0.5 - 1.9 mmol/L    Comment: Performed at [facility], [address], [place], [place] [address]  I-stat chem 8, ED (not at [facility], [facility] or [facility])     Status: Abnormal   Collection Time: 12/18/22  6:43 PM  Result Value Ref Range   Sodium 122 (L) 135 - 145 mmol/L   Potassium 4.4 3.5 - 5.1 mmol/L   Chloride 86 (L) 98 - 111 mmol/L   BUN 30 (H) 8 - 23 mg/dL   Creatinine, Ser 0.99 (H) 0.44 - 1.00 mg/dL   Glucose, Bld 833 (H) 70 - 99 mg/dL    Comment: Glucose reference range applies only to samples taken after fasting for at least 8 hours.   Calcium, Ion  1.09 (L) 1.15 - 1.40 mmol/L   TCO2 28 22 - 32 mmol/L   Hemoglobin 11.2 (L) 12.0 - 15.0 g/dL   HCT 16.1 (L) 09.6 - 04.5 %  CBC with Differential     Status: Abnormal   Collection Time: 12/18/22  7:34 PM  Result Value Ref Range   WBC 4.5 4.0 - 10.5 K/uL   RBC 3.23 (L) 3.87 - 5.11 MIL/uL   Hemoglobin 10.0 (L) 12.0 - 15.0 g/dL   HCT 40.9 (L) 81.1 - 91.4 %   MCV 93.5 80.0 - 100.0 fL   MCH 31.0 26.0 - 34.0 pg   MCHC 33.1 30.0 - 36.0 g/dL   RDW 78.2 95.6 - 21.3 %   Platelets 141 (L) 150 - 400 K/uL   nRBC 0.0 0.0 - 0.2 %   Neutrophils Relative % 76 %   Neutro Abs 3.4 1.7 - 7.7 K/uL   Lymphocytes Relative 19 %   Lymphs Abs 0.9 0.7 - 4.0 K/uL   Monocytes Relative 5 %   Monocytes Absolute 0.2 0.1 - 1.0 K/uL   Eosinophils Relative 0 %   Eosinophils Absolute 0.0 0.0 - 0.5 K/uL   Basophils Relative 0 %   Basophils Absolute 0.0 0.0 - 0.1 K/uL    nRBC 0 0 /100 WBC   Abs Immature Granulocytes 0.00 0.00 - 0.07 K/uL    Comment: Performed at [facility], [address], [place], [place] [address]  Comprehensive metabolic panel     Status: Abnormal   Collection Time: 12/18/22  7:34 PM  Result Value Ref Range   Sodium 120 (L) 135 - 145 mmol/L   Potassium 4.3 3.5 - 5.1 mmol/L   Chloride 84 (L) 98 - 111 mmol/L   CO2 25 22 - 32 mmol/L   Glucose, Bld 114 (H) 70 - 99 mg/dL    Comment: Glucose reference range applies only to samples taken after fasting for at least 8 hours.   BUN 27 (H) 8 - 23 mg/dL   Creatinine, Ser 8.46 (H) 0.44 - 1.00 mg/dL   Calcium 8.3 (L) 8.9 - 10.3 mg/dL   Total Protein 7.0 6.5 - 8.1 g/dL   Albumin 3.3 (L) 3.5 - 5.0 g/dL   AST 46 (H) 15 - 41 U/L   ALT 35 0 - 44 U/L   Alkaline Phosphatase 78 38 - 126 U/L   Total Bilirubin 0.7 0.3 - 1.2 mg/dL   GFR, Estimated 37 (L) >60 mL/min    Comment: (NOTE) Calculated using the CKD-EPI Creatinine Equation (2021)    Anion gap 11 5 - 15    Comment: Performed at [facility], [address], [place], [place] [address]  Lactic acid, plasma     Status: None   Collection Time: 12/18/22  7:58 PM  Result Value Ref Range   Lactic Acid, Venous 1.0 0.5 - 1.9 mmol/L    Comment: Performed at [facility], [address] [place], [place] [address]  Osmolality     Status: Abnormal   Collection Time: 12/18/22  7:58 PM  Result Value Ref Range   Osmolality 273 (L) 275 - 295 mOsm/kg    Comment: Performed at [facility], [address], [place], [place] [address]  Troponin I (High Sensitivity)     Status: Abnormal   Collection Time: 12/18/22  7:58 PM  Result Value Ref Range   Troponin I (High Sensitivity) 36 (H) <18 ng/L    Comment: (NOTE) Elevated  high sensitivity troponin I (hsTnI) values and significant  changes across serial measurements may suggest ACS but many other  chronic and acute conditions are known to elevate hsTnI results.  Refer to the "Links"  section for chest pain algorithms and additional  guidance. Performed at [facility], [address], [place], [place] [address]   Brain natriuretic peptide     Status: Abnormal   Collection Time: 12/18/22  7:58 PM  Result Value Ref Range   B Natriuretic Peptide 599.1 (H) 0.0 - 100.0 pg/mL    Comment: Performed at [facility], [address] [place], [place] [address]  Resp panel by RT-PCR (RSV, Flu A&B, Covid) Anterior Nasal Swab     Status: None   Collection Time: 12/18/22  8:06 PM   Specimen: Anterior Nasal Swab  Result Value Ref Range   SARS Coronavirus 2 by RT PCR NEGATIVE NEGATIVE   Influenza A by PCR NEGATIVE NEGATIVE   Influenza B by PCR NEGATIVE NEGATIVE    Comment: (NOTE) The Xpert Xpress SARS-CoV-2/FLU/RSV plus assay is intended as an aid in the diagnosis of influenza from Nasopharyngeal swab specimens and should not be used as a sole basis for treatment. Nasal washings and aspirates are unacceptable for Xpert Xpress SARS-CoV-2/FLU/RSV testing.  Fact Sheet for Patients: BloggerCourse.com  Fact Sheet for Healthcare Providers: SeriousBroker.it  This test is not yet approved or cleared by the [place] FDA and has been authorized for detection and/or diagnosis of SARS-CoV-2 by FDA under an Emergency Use Authorization (EUA). This EUA will remain in effect (meaning this test can be used) for the duration of the COVID-19 declaration under Section 564(b)(1) of the Act, 21 U.S.C. section 360bbb-3(b)(1), unless the authorization is terminated or revoked.     Resp Syncytial Virus by PCR NEGATIVE NEGATIVE    Comment: (NOTE) Fact Sheet for Patients: BloggerCourse.com  Fact Sheet for Healthcare Providers: SeriousBroker.it  This test is not yet approved or cleared by the [place] FDA and has been authorized for detection and/or diagnosis of SARS-CoV-2  by FDA under an Emergency Use Authorization (EUA). This EUA will remain in effect (meaning this test can be used) for the duration of the COVID-19 declaration under Section 564(b)(1) of the Act, 21 U.S.C. section 360bbb-3(b)(1), unless the authorization is terminated or revoked.  Performed at [facility], [address], [place], [place] [address]    DG Chest 2 View  Result Date: 12/18/2022 CLINICAL DATA:  Short of breath.  Cough. EXAM: CHEST - 2 VIEW COMPARISON:  05/06/2016. FINDINGS: Mild enlargement of the cardiac silhouette. No mediastinal or hilar masses. No evidence of adenopathy. Lungs are hyperexpanded. Streaky opacities noted in the medial lower lobes consistent with chronic bronchitic change and unchanged from the prior exam. No evidence of pneumonia or pulmonary edema. No pleural effusion or pneumothorax. Skeletal structures demineralized, grossly intact. IMPRESSION: 1. No acute cardiopulmonary disease. Electronically Signed   By: Amie Portland M.D.   On: 12/18/2022 12:48    Pending Labs Unresulted Labs (From admission, onward)     Start     Ordered   12/18/22 1938  Osmolality, urine  Once,   URGENT        12/18/22 1937   12/18/22 1938  Na and K (sodium & potassium), rand urine  Once,   URGENT        12/18/22 1937            Vitals/Pain Today's Vitals   12/18/22 2215  12/18/22 2230 12/18/22 2238 12/18/22 2245  BP: (!) 181/142 (!) 154/136  120/79  Pulse:      Resp: (!) 22 18  (!) 21  Temp:   98.2 F (36.8 C)   TempSrc:   Oral   SpO2:        Isolation Precautions No active isolations  Medications Medications  apixaban (ELIQUIS) tablet 2.5 mg (2.5 mg Oral Given 12/18/22 2257)  amLODipine (NORVASC) tablet 5 mg (has no administration in time range)  albuterol (PROVENTIL) (2.5 MG/3ML) 0.083% nebulizer solution 2.5 mg (has no administration in time range)    Mobility walks with device     Focused Assessments Cardiac Assessment Handoff:    No  results found for: "CKTOTAL", "CKMB", "CKMBINDEX", "TROPONINI" No results found for: "DDIMER" Does the Patient currently have chest pain? No    R Recommendations: See Admitting Provider Note  Report given to:   Additional Notes:

## 2022-12-19 ENCOUNTER — Observation Stay (HOSPITAL_COMMUNITY): Payer: Medicare Other

## 2022-12-19 DIAGNOSIS — R1310 Dysphagia, unspecified: Secondary | ICD-10-CM | POA: Diagnosis present

## 2022-12-19 DIAGNOSIS — Z881 Allergy status to other antibiotic agents status: Secondary | ICD-10-CM | POA: Diagnosis not present

## 2022-12-19 DIAGNOSIS — J90 Pleural effusion, not elsewhere classified: Secondary | ICD-10-CM | POA: Diagnosis not present

## 2022-12-19 DIAGNOSIS — N179 Acute kidney failure, unspecified: Secondary | ICD-10-CM | POA: Diagnosis present

## 2022-12-19 DIAGNOSIS — E871 Hypo-osmolality and hyponatremia: Secondary | ICD-10-CM

## 2022-12-19 DIAGNOSIS — J811 Chronic pulmonary edema: Secondary | ICD-10-CM | POA: Diagnosis not present

## 2022-12-19 DIAGNOSIS — K5900 Constipation, unspecified: Secondary | ICD-10-CM | POA: Diagnosis not present

## 2022-12-19 DIAGNOSIS — J45901 Unspecified asthma with (acute) exacerbation: Secondary | ICD-10-CM | POA: Diagnosis present

## 2022-12-19 DIAGNOSIS — E222 Syndrome of inappropriate secretion of antidiuretic hormone: Secondary | ICD-10-CM | POA: Diagnosis present

## 2022-12-19 DIAGNOSIS — T17890A Other foreign object in other parts of respiratory tract causing asphyxiation, initial encounter: Secondary | ICD-10-CM | POA: Diagnosis present

## 2022-12-19 DIAGNOSIS — I4891 Unspecified atrial fibrillation: Secondary | ICD-10-CM

## 2022-12-19 DIAGNOSIS — I5033 Acute on chronic diastolic (congestive) heart failure: Secondary | ICD-10-CM | POA: Diagnosis present

## 2022-12-19 DIAGNOSIS — N1832 Chronic kidney disease, stage 3b: Secondary | ICD-10-CM | POA: Diagnosis present

## 2022-12-19 DIAGNOSIS — Z8701 Personal history of pneumonia (recurrent): Secondary | ICD-10-CM | POA: Diagnosis not present

## 2022-12-19 DIAGNOSIS — I700 Atherosclerosis of aorta: Secondary | ICD-10-CM | POA: Diagnosis present

## 2022-12-19 DIAGNOSIS — D631 Anemia in chronic kidney disease: Secondary | ICD-10-CM | POA: Diagnosis present

## 2022-12-19 DIAGNOSIS — I2489 Other forms of acute ischemic heart disease: Secondary | ICD-10-CM | POA: Diagnosis present

## 2022-12-19 DIAGNOSIS — I071 Rheumatic tricuspid insufficiency: Secondary | ICD-10-CM | POA: Diagnosis present

## 2022-12-19 DIAGNOSIS — I4819 Other persistent atrial fibrillation: Secondary | ICD-10-CM

## 2022-12-19 DIAGNOSIS — R001 Bradycardia, unspecified: Secondary | ICD-10-CM | POA: Diagnosis not present

## 2022-12-19 DIAGNOSIS — Z79899 Other long term (current) drug therapy: Secondary | ICD-10-CM | POA: Diagnosis not present

## 2022-12-19 DIAGNOSIS — Z85828 Personal history of other malignant neoplasm of skin: Secondary | ICD-10-CM | POA: Diagnosis not present

## 2022-12-19 DIAGNOSIS — Z1152 Encounter for screening for COVID-19: Secondary | ICD-10-CM | POA: Diagnosis not present

## 2022-12-19 DIAGNOSIS — B9789 Other viral agents as the cause of diseases classified elsewhere: Secondary | ICD-10-CM | POA: Diagnosis present

## 2022-12-19 DIAGNOSIS — I4821 Permanent atrial fibrillation: Secondary | ICD-10-CM | POA: Diagnosis present

## 2022-12-19 DIAGNOSIS — Z66 Do not resuscitate: Secondary | ICD-10-CM | POA: Diagnosis present

## 2022-12-19 DIAGNOSIS — R0602 Shortness of breath: Secondary | ICD-10-CM | POA: Diagnosis not present

## 2022-12-19 DIAGNOSIS — D696 Thrombocytopenia, unspecified: Secondary | ICD-10-CM | POA: Diagnosis present

## 2022-12-19 DIAGNOSIS — J45909 Unspecified asthma, uncomplicated: Secondary | ICD-10-CM | POA: Diagnosis not present

## 2022-12-19 DIAGNOSIS — I5032 Chronic diastolic (congestive) heart failure: Secondary | ICD-10-CM | POA: Diagnosis present

## 2022-12-19 DIAGNOSIS — I272 Pulmonary hypertension, unspecified: Secondary | ICD-10-CM | POA: Diagnosis present

## 2022-12-19 DIAGNOSIS — Z7901 Long term (current) use of anticoagulants: Secondary | ICD-10-CM | POA: Diagnosis not present

## 2022-12-19 DIAGNOSIS — I130 Hypertensive heart and chronic kidney disease with heart failure and stage 1 through stage 4 chronic kidney disease, or unspecified chronic kidney disease: Secondary | ICD-10-CM | POA: Diagnosis present

## 2022-12-19 LAB — OSMOLALITY, URINE: Osmolality, Ur: 356 mOsm/kg (ref 300–900)

## 2022-12-19 LAB — ECHOCARDIOGRAM COMPLETE
Area-P 1/2: 4.33 cm2
Calc EF: 49.6 %
S' Lateral: 2.8 cm
Single Plane A2C EF: 58.7 %
Single Plane A4C EF: 39.8 %
Weight: 2469.15 oz

## 2022-12-19 LAB — SODIUM
Sodium: 122 mmol/L — ABNORMAL LOW (ref 135–145)
Sodium: 121 mmol/L — ABNORMAL LOW (ref 135–145)

## 2022-12-19 LAB — BASIC METABOLIC PANEL
Anion gap: 13 (ref 5–15)
BUN: 32 mg/dL — ABNORMAL HIGH (ref 8–23)
CO2: 27 mmol/L (ref 22–32)
Calcium: 8.6 mg/dL — ABNORMAL LOW (ref 8.9–10.3)
Chloride: 82 mmol/L — ABNORMAL LOW (ref 98–111)
Creatinine, Ser: 1.21 mg/dL — ABNORMAL HIGH (ref 0.44–1.00)
GFR, Estimated: 41 mL/min — ABNORMAL LOW (ref >60)
Glucose, Bld: 177 mg/dL — ABNORMAL HIGH (ref 70–99)
Potassium: 3.9 mmol/L (ref 3.5–5.1)
Sodium: 122 mmol/L — ABNORMAL LOW (ref 135–145)

## 2022-12-19 LAB — CBC
HCT: 30.5 % — ABNORMAL LOW (ref 36.0–46.0)
Hemoglobin: 10.2 g/dL — ABNORMAL LOW (ref 12.0–15.0)
MCH: 30.8 pg (ref 26.0–34.0)
MCHC: 33.4 g/dL (ref 30.0–36.0)
MCV: 92.1 fL (ref 80.0–100.0)
Platelets: 141 10*3/uL — ABNORMAL LOW (ref 150–400)
RBC: 3.31 MIL/uL — ABNORMAL LOW (ref 3.87–5.11)
RDW: 12.8 % (ref 11.5–15.5)
WBC: 3.9 10*3/uL — ABNORMAL LOW (ref 4.0–10.5)
nRBC: 0 % (ref 0.0–0.2)

## 2022-12-19 LAB — TROPONIN I (HIGH SENSITIVITY)
Troponin I (High Sensitivity): 31 ng/L — ABNORMAL HIGH (ref <18)
Troponin I (High Sensitivity): 32 ng/L — ABNORMAL HIGH (ref <18)

## 2022-12-19 LAB — NA AND K (SODIUM & POTASSIUM), RAND UR
Potassium Urine: 24 mmol/L
Sodium, Ur: 62 mmol/L

## 2022-12-19 LAB — LACTIC ACID, PLASMA
Lactic Acid, Venous: 1.7 mmol/L (ref 0.5–1.9)
Lactic Acid, Venous: 2.4 mmol/L (ref 0.5–1.9)

## 2022-12-19 LAB — MAGNESIUM: Magnesium: 1.7 mg/dL (ref 1.7–2.4)

## 2022-12-19 LAB — PROCALCITONIN: Procalcitonin: <0.1 ng/mL

## 2022-12-19 MED ORDER — SODIUM CHLORIDE 0.9% FLUSH
3.0000 mL | Freq: Two times a day (BID) | INTRAVENOUS | Status: DC
  Administered 2022-12-19 – 2022-12-24 (×11): 3 mL via INTRAVENOUS

## 2022-12-19 MED ORDER — BENZONATATE 100 MG PO CAPS
200.0000 mg | ORAL_CAPSULE | Freq: Three times a day (TID) | ORAL | Status: DC | PRN
  Administered 2022-12-19 – 2022-12-23 (×5): 200 mg via ORAL
  Filled 2022-12-19 (×5): qty 2

## 2022-12-19 MED ORDER — ONDANSETRON HCL 4 MG/2ML IJ SOLN: 4.0000 mg | Freq: Four times a day (QID) | INTRAMUSCULAR | Status: DC | PRN

## 2022-12-19 MED ORDER — DOXYCYCLINE HYCLATE 100 MG PO TABS
100.0000 mg | ORAL_TABLET | Freq: Two times a day (BID) | ORAL | Status: DC
  Administered 2022-12-19 (×2): 100 mg via ORAL
  Filled 2022-12-19 (×3): qty 1

## 2022-12-19 MED ORDER — POTASSIUM CHLORIDE 20 MEQ PO PACK
20.0000 meq | PACK | Freq: Once | ORAL | Status: AC
  Administered 2022-12-19: 20 meq via ORAL
  Filled 2022-12-19: qty 1

## 2022-12-19 MED ORDER — FUROSEMIDE 10 MG/ML IJ SOLN
40.0000 mg | Freq: Two times a day (BID) | INTRAMUSCULAR | Status: DC
  Administered 2022-12-19 – 2022-12-20 (×2): 40 mg via INTRAVENOUS
  Filled 2022-12-19: qty 4

## 2022-12-19 MED ORDER — FUROSEMIDE 10 MG/ML IJ SOLN
40.0000 mg | Freq: Once | INTRAMUSCULAR | Status: AC
  Administered 2022-12-19: 40 mg via INTRAVENOUS
  Filled 2022-12-19: qty 4

## 2022-12-19 MED ORDER — NITROGLYCERIN 0.4 MG SL SUBL: 0.4000 mg | SUBLINGUAL_TABLET | SUBLINGUAL | Status: DC | PRN

## 2022-12-19 MED ORDER — MOMETASONE FURO-FORMOTEROL FUM 200-5 MCG/ACT IN AERO
2.0000 | INHALATION_SPRAY | Freq: Two times a day (BID) | RESPIRATORY_TRACT | Status: DC
  Administered 2022-12-19 – 2022-12-24 (×9): 2 via RESPIRATORY_TRACT
  Filled 2022-12-19: qty 8.8

## 2022-12-19 MED ORDER — SODIUM CHLORIDE 0.9% FLUSH: 3.0000 mL | INTRAVENOUS | Status: DC | PRN

## 2022-12-19 MED ORDER — METHYLPREDNISOLONE SODIUM SUCC 125 MG IJ SOLR
125.0000 mg | Freq: Once | INTRAMUSCULAR | Status: AC
  Administered 2022-12-19: 125 mg via INTRAVENOUS
  Filled 2022-12-19: qty 2

## 2022-12-19 MED ORDER — SODIUM CHLORIDE 0.9 % IV SOLN: 250.0000 mL | INTRAVENOUS | Status: DC | PRN

## 2022-12-19 MED ORDER — MAGNESIUM SULFATE 2 GM/50ML IV SOLN
2.0000 g | Freq: Once | INTRAVENOUS | Status: AC
  Administered 2022-12-19: 2 g via INTRAVENOUS
  Filled 2022-12-19: qty 50

## 2022-12-19 MED ORDER — ALBUTEROL SULFATE (2.5 MG/3ML) 0.083% IN NEBU
2.5000 mg | INHALATION_SOLUTION | RESPIRATORY_TRACT | Status: DC
  Administered 2022-12-19 – 2022-12-20 (×5): 2.5 mg via RESPIRATORY_TRACT
  Filled 2022-12-19 (×6): qty 3

## 2022-12-19 MED ORDER — PREDNISONE 20 MG PO TABS
40.0000 mg | ORAL_TABLET | Freq: Every day | ORAL | Status: DC
  Administered 2022-12-20 – 2022-12-21 (×2): 40 mg via ORAL
  Filled 2022-12-19 (×2): qty 2

## 2022-12-19 MED ORDER — ACETAMINOPHEN 325 MG PO TABS: 650.0000 mg | ORAL_TABLET | ORAL | Status: DC | PRN

## 2022-12-19 NOTE — H&P (Signed)
History and Physical    Patient: Karina Chase:[id number] DOB: 1925-08-12 DOA: 12/18/2022 DOS: the patient was seen and examined on 12/19/2022 PCP: Johny Blamer, MD  Patient coming from: Home  Chief Complaint:  Chief Complaint  Patient presents with   Abnormal Labs   Difficulty Breathing   HPI: Karina Chase is a 89 y.o. female with medical history significant of Hypertensive heart dz, PAF on eliquis.  Dr. Dulce Sellar recently increased Lasix to BID back in March.  Pt with cough and SOB for past week, no fevers.  No CP.  Has generalized weakness.  Saw pcp who thought she might have PNA so started her on doxycycline + prednisone last night.  Symptoms progressively worse today.  In to ED.   Review of Systems: As mentioned in the history of present illness. All other systems reviewed and are negative. Past Medical History:  Diagnosis Date   Arthritis    Asthma    Atrial fibrillation    intermittent   Breast cancer    Cancer    Basal cell skin cancer   Colon polyp    GERD (gastroesophageal reflux disease)    History of shingles    Hypertension    Hypertensive heart disease 05/30/2020   Right groin pain 03/28/2013   Stress incontinence    Varicose veins    Vertigo    Past Surgical History:  Procedure Laterality Date   ABDOMINAL HYSTERECTOMY  1972   BSO   BREAST SURGERY     lumpectomy   SKIN CANCER EXCISION  2014   Social History:  reports that she has never smoked. She has never used smokeless tobacco. She reports that she does not drink alcohol and does not use drugs.  Allergies  Allergen Reactions   Levofloxacin Nausea And Vomiting   Keflex [Cephalexin]    Prilosec [Omeprazole]    Sulfa Antibiotics    Sulfamethoxazole-Trimethoprim     Other reaction(s): rash   Azithromycin Rash   Penicillin G Rash   Penicillins Rash    Family History  Problem Relation Age of Onset   Stroke Mother    Heart disease Father    Cancer Maternal Grandmother         breast    Prior to Admission medications   Medication Sig Start Date End Date Taking? Authorizing Provider  amLODipine (NORVASC) 5 MG tablet Take 5 mg by mouth daily.    [provider]  ELIQUIS 2.5 MG TABS tablet TAKE ONE TABLET BY MOUTH TWICE DAILY 08/17/22   Baldo Daub, MD  furosemide (LASIX) 20 MG tablet Take 1 tablet (20 mg total) by mouth 2 (two) times daily. 09/29/22   Baldo Daub, MD  nitroGLYCERIN (NITROSTAT) 0.4 MG SL tablet Place 1 tablet (0.4 mg total) under the tongue every 5 (five) minutes as needed for chest pain. 09/11/22   Baldo Daub, MD    Physical Exam: Vitals:   12/18/22 2238 12/18/22 2245 12/18/22 2300 12/19/22 0052  BP:  120/79 120/88 129/67  Pulse:    65  Resp:  (!) 21 (!) 21 17  Temp: 98.2 F (36.8 C)   98.3 F (36.8 C)  TempSrc: Oral     SpO2:    92%   Constitutional: NAD, calm, comfortable Respiratory: Coarse BS bilaterally with wheezing Cardiovascular: Irr, irr, trace BLE edema.  Abdomen: no tenderness, no masses palpated. No hepatosplenomegaly. Bowel sounds positive.  Neurologic: CN 2-12 grossly intact. Sensation intact, DTR normal. Strength 5/5 in all  4.  Psychiatric: Normal judgment and insight. Alert and oriented x 3. Normal mood.   Data Reviewed:       Latest Ref Rng & Units 12/18/2022    7:34 PM 12/18/2022    6:43 PM  CBC  WBC 4.0 - 10.5 K/uL 4.5    Hemoglobin 12.0 - 15.0 g/dL 14.4  31.5   Hematocrit 36.0 - 46.0 % 30.2  33.0   Platelets 150 - 400 K/uL 141        Latest Ref Rng & Units 12/18/2022    7:34 PM 12/18/2022    6:43 PM 12/13/2020    2:47 PM  CMP  Glucose 70 - 99 mg/dL 400  867  619   BUN 8 - 23 mg/dL 27  30  21    Creatinine 0.44 - 1.00 mg/dL 5.09  3.26  7.12   Sodium 135 - 145 mmol/L 120  122  140   Potassium 3.5 - 5.1 mmol/L 4.3  4.4  4.1   Chloride 98 - 111 mmol/L 84  86  100   CO2 22 - 32 mmol/L 25   24   Calcium 8.9 - 10.3 mg/dL 8.3   9.3   Total Protein 6.5 - 8.1 g/dL 7.0     Total Bilirubin 0.3 -  1.2 mg/dL 0.7     Alkaline Phos 38 - 126 U/L 78     AST 15 - 41 U/L 46     ALT 0 - 44 U/L 35      BNP    Component Value Date/Time   BNP 599.1 (H) 12/18/2022 1958    ProBNP    Component Value Date/Time   PROBNP 2,698 (H) 12/13/2020 1447   Trops: 36 -> 37  Covid, flu, and RSV = negative  CT CHEST w/o contrast: IMPRESSION: 1. Mild pulmonary edema with trace right pleural effusion. 2. Ground-glass opacities in the posterior right lower lobe, which could be due to edema or infection/inflammation. 3. Bronchial wall thickening and mucous plugging in the lower lobes. 4. Cardiomegaly and dilated IVC compatible with elevated right heart pressures. 5. Enlarged main pulmonary artery, which can be seen in the setting of pulmonary hypertension. 6. Coronary artery atherosclerosis   Aortic Atherosclerosis (ICD10-I70.0).  Assessment and Plan: * Acute on chronic diastolic CHF (congestive heart failure) Symptoms would appear to be due to decompensated CHF most likely as supported by: 1) BNP elevation 2) mild trop elevation 3) CT chest demonstrating pulm edema + findings suggestive of PAH + IVC dilation and volume overload 4) no SIRS, covid, flu, rsv are neg  PNA is, of course, still in the differential, but seems a bit less likely.  CHF pathway Trying 40mg  IV lasix x1 now, will hold off ordering further lasix until we see how she responds clinically and how her sodium responds to this lasix dose Tele monitor 2d echo Neb treatment for wheezing Holding steroids for now (dont want to make fluid retention worse). Strict intake and output Daily BMP. Ill hold off on re-ordering doxycycline for the moment until we see how she responds to lasix + neb treatments.  Hyponatremia Appears to be hypervolemic hyponatremia as discussed above. Trying lasix as above Repeat BMP in AM Urine sodium and osm pending  Atrial fibrillation Cont eliquis Not on any rate control meds.      Advance  Care Planning:   Code Status: DNR Confirmed with pt and daughter  Consults: None  Family Communication: Daughter at bedside  Severity of Illness: The  appropriate patient status for this patient is OBSERVATION. Observation status is judged to be reasonable and necessary in order to provide the required intensity of service to ensure the patient's safety. The patient's presenting symptoms, physical exam findings, and initial radiographic and laboratory data in the context of their medical condition is felt to place them at decreased risk for further clinical deterioration. Furthermore, it is anticipated that the patient will be medically stable for discharge from the hospital within 2 midnights of admission.   Author: Hillary Bow., DO 12/19/2022 1:51 AM  For on call review [email or web address].

## 2022-12-19 NOTE — Assessment & Plan Note (Addendum)
Symptoms would appear to be due to decompensated CHF most likely as supported by: 1) BNP elevation 2) mild trop elevation 3) CT chest demonstrating pulm edema + findings suggestive of PAH + IVC dilation and volume overload 4) no SIRS, covid, flu, rsv are neg  PNA is, of course, still in the differential, but seems a bit less likely.  CHF pathway Trying 40mg  IV lasix x1 now, will hold off ordering further lasix until we see how she responds clinically and how her sodium responds to this lasix dose Tele monitor 2d echo Neb treatment for wheezing Holding steroids for now (dont want to make fluid retention worse). Strict intake and output Daily BMP. Ill hold off on re-ordering doxycycline for the moment until we see how she responds to lasix + neb treatments.

## 2022-12-19 NOTE — Assessment & Plan Note (Signed)
Cont eliquis. Not on any rate control meds. 

## 2022-12-19 NOTE — Assessment & Plan Note (Signed)
Appears to be hypervolemic hyponatremia as discussed above. Trying lasix as above Repeat BMP in AM Urine sodium and osm pending

## 2022-12-19 NOTE — Progress Notes (Signed)
PROGRESS NOTE    Karina Chase  WGN:[id number] DOB: Jun 20, 1925 DOA: 12/18/2022 PCP: Johny Blamer, MD   Brief Narrative:  She is a pleasant 89 year old female with past medical history of persistent A-fib, on chronic anticoagulation, hypertensive heart disease with chronic diastolic CHF, CKD stage III, asthma present here with complaining of shortness of breath.  Her cardiologist recently increase Lasix to twice daily back in March.  She presented with cough, shortness of breath since 1 week.  She was given doxycycline and prednisone by her PCP however symptoms progressively worse therefore she came to the ER for further eval and management.  ED course: Temperature: 98.2, RR: 21, BP: 129/67, pulse: 65, on room air, BUN/creatinine: 27/1.32, sodium 120.  H&H: 10/30.2, platelet: 141.  AST: 46.  BNP elevated at 599.  Troponin: 36--> 37.  COVID flu, RSV all negative.  CT chest without contrast shows mild pulmonary edema with trace right pleural effusion.  Groundglass opacities in the posterior right lobe which could be due to edema or infection/inflammation.  Bronchial wall thickening and mucous plugging in lower lobes.  Cardiomegaly and dilated IVC.  Enlargement pulmonary artery seen and pulmonary hypertension.  Patient was given a dose of Lasix and admitted for further eval and management of CHF exacerbation.  Assessment & Plan:   Acute on chronic diastolic CHF: -Presented with worsening cough and shortness of breath.  BNP elevated at 599.  Reviewed CT chest as above.  Patient received Lasix in ED.  Will start on Lasix 40 IV twice daily -Echo is pending.  Strict INO's and daily weight. Monitor electrolytes and replenish as needed keep potassium above 4, magnesium above 2.   Acute asthma exacerbation: -Tested negative for COVID, flu and RSV. Diffuse wheezing noted on exam.  On room air.   -Solu-Medrol IV once given today.  Started on prednisone starting tomorrow.  Started on nebulizer treatment.   Check procalcitonin, lactic acid.   -Started on doxycycline  Persistent A-fib: -Continue Eliquis.  Not on any rate control medicines at home  Elevated troponin: Patient denies ACS symptoms.  Likely demand ischemia.  Will trend.  -Hypertensive heart disease: -Stable.  Continue amlodipine  AKI on CKD stage IIIa: -Baseline creatinine: 1.1, trended up to 1.3 on admission.  Will continue to monitor closely.  Hyponatremia: SIADH:  Sodium 120 on admission.  -Low serum osmolality, urine sodium about 40, urine osmolality above 100 -Reviewed home medications. -Sodium improved from 1 20-1 22.  Will continue to monitor.  Normocytic anemia: Will continue to monitor.  H&H is stable  Thrombocytopenia: Platelet 141.  Will continue to monitor  DVT prophylaxis: Eliquis Code Status: DNR Family Communication: Patient is daughter present at bedside.  Plan of care discussed with patient in length and she verbalized understanding and agreed with it. Plan of care discussed with patient daughter on the phone and she verbalized understanding Disposition Plan: To be determined  Consultants:  None  Procedures:  None  Antimicrobials:  Doxy  Status is: Observation   Subjective: Patient seen and examined.  Lying comfortably on the bed.  Continues to have wheezing.  Remained afebrile.  No acute events overnight.  Objective: Vitals:   12/19/22 0608 12/19/22 0845 12/19/22 0859 12/19/22 0900  BP: 131/85 (!) 158/79    Pulse: 70 (!) 45    Resp: 18 17    Temp: 97.9 F (36.6 C) 97.8 F (36.6 C)    TempSrc: Oral Oral    SpO2: (!) 89% 92% 93% 93%  Weight:  Intake/Output Summary (Last 24 hours) at 12/19/2022 1055 Last data filed at 12/19/2022 0654 Gross per 24 hour  Intake 240 ml  Output 400 ml  Net -160 ml   Filed Weights   12/19/22 0145  Weight: 70 kg    Examination:  General exam: Appears calm and comfortable, elderly female lying comfortably on the bed, on room air, daughter  at the bedside sleeping Respiratory system: Diffuse audible inspiratory and expiratory wheezing noted Cardiovascular system: S1 & S2 heard, RRR. No JVD, murmurs, rubs, gallops or clicks.  Bilateral 1+ pitting edema positive  gastrointestinal system: Abdomen is nondistended, soft and nontender. No organomegaly or masses felt. Normal bowel sounds heard. Central nervous system: Alert and oriented. No focal neurological deficits. Extremities: Symmetric 5 x 5 power. Skin: No rashes, lesions or ulcers Psychiatry: Judgement and insight appear normal. Mood & affect appropriate.    Data Reviewed: I have personally reviewed following labs and imaging studies  CBC: Recent Labs  Lab 12/18/22 1843 12/18/22 1934  WBC  --  4.5  NEUTROABS  --  3.4  HGB 11.2* 10.0*  HCT 33.0* 30.2*  MCV  --  93.5  PLT  --  141*   Basic Metabolic Panel: Recent Labs  Lab 12/18/22 1843 12/18/22 1934  NA 122* 120*  K 4.4 4.3  CL 86* 84*  CO2  --  25  GLUCOSE 118* 114*  BUN 30* 27*  CREATININE 1.30* 1.32*  CALCIUM  --  8.3*   GFR: Estimated Creatinine Clearance: 23.9 mL/min (A) (by C-G formula based on SCr of 1.32 mg/dL (H)). Liver Function Tests: Recent Labs  Lab 12/18/22 1934  AST 46*  ALT 35  ALKPHOS 78  BILITOT 0.7  PROT 7.0  ALBUMIN 3.3*   No results for input(s): "LIPASE", "AMYLASE" in the last 168 hours. No results for input(s): "AMMONIA" in the last 168 hours. Coagulation Profile: No results for input(s): "INR", "PROTIME" in the last 168 hours. Cardiac Enzymes: No results for input(s): "CKTOTAL", "CKMB", "CKMBINDEX", "TROPONINI" in the last 168 hours. BNP (last 3 results) No results for input(s): "PROBNP" in the last 8760 hours. HbA1C: No results for input(s): "HGBA1C" in the last 72 hours. CBG: No results for input(s): "GLUCAP" in the last 168 hours. Lipid Profile: No results for input(s): "CHOL", "HDL", "LDLCALC", "TRIG", "CHOLHDL", "LDLDIRECT" in the last 72 hours. Thyroid  Function Tests: No results for input(s): "TSH", "T4TOTAL", "FREET4", "T3FREE", "THYROIDAB" in the last 72 hours. Anemia Panel: No results for input(s): "VITAMINB12", "FOLATE", "FERRITIN", "TIBC", "IRON", "RETICCTPCT" in the last 72 hours. Sepsis Labs: Recent Labs  Lab 12/18/22 1828 12/18/22 1958  LATICACIDVEN 0.8 1.0    Recent Results (from the past 240 hour(s))  Resp panel by RT-PCR (RSV, Flu A&B, Covid) Anterior Nasal Swab     Status: None   Collection Time: 12/18/22  8:06 PM   Specimen: Anterior Nasal Swab  Result Value Ref Range Status   SARS Coronavirus 2 by RT PCR NEGATIVE NEGATIVE Final   Influenza A by PCR NEGATIVE NEGATIVE Final   Influenza B by PCR NEGATIVE NEGATIVE Final    Comment: (NOTE) The Xpert Xpress SARS-CoV-2/FLU/RSV plus assay is intended as an aid in the diagnosis of influenza from Nasopharyngeal swab specimens and should not be used as a sole basis for treatment. Nasal washings and aspirates are unacceptable for Xpert Xpress SARS-CoV-2/FLU/RSV testing.  Fact Sheet for Patients: BloggerCourse.com  Fact Sheet for Healthcare Providers: SeriousBroker.it  This test is not yet approved or cleared by the  [place] [employer] and has been authorized for detection and/or diagnosis of SARS-CoV-2 by FDA under an TEFL teacher (EUA). This EUA will remain in effect (meaning this test can be used) for the duration of the COVID-19 declaration under Section 564(b)(1) of the Act, 21 U.S.C. section 360bbb-3(b)(1), unless the authorization is terminated or revoked.     Resp Syncytial Virus by PCR NEGATIVE NEGATIVE Final    Comment: (NOTE) Fact Sheet for Patients: BloggerCourse.com  Fact Sheet for Healthcare Providers: SeriousBroker.it  This test is not yet approved or cleared by the [place] FDA and has been authorized for detection and/or diagnosis  of SARS-CoV-2 by FDA under an Emergency Use Authorization (EUA). This EUA will remain in effect (meaning this test can be used) for the duration of the COVID-19 declaration under Section 564(b)(1) of the Act, 21 U.S.C. section 360bbb-3(b)(1), unless the authorization is terminated or revoked.  Performed at [facility], [address] [place], [place] [address]       Radiology Studies: CT Chest Wo Contrast  Result Date: 12/19/2022 CLINICAL DATA:  Shortness of breath, cough, history of asthma. Pneumonia complication suspected EXAM: CT CHEST WITHOUT CONTRAST TECHNIQUE: Multidetector CT imaging of the chest was performed following the standard protocol without IV contrast. RADIATION DOSE REDUCTION: This exam was performed according to the departmental dose-optimization program which includes automated exposure control, adjustment of the mA and/or kV according to patient size and/or use of iterative reconstruction technique. COMPARISON:  Radiographs 12/18/2022 FINDINGS: Cardiovascular: Cardiomegaly. Coronary artery and aortic atherosclerotic calcification. No pericardial effusion. The main pulmonary artery measuring 34 mm in diameter. Mediastinum/Nodes: Mediastinal and hilar adenopathy are suboptimally assessed without IV contrast. Prominent precarinal lymph node measures 1.4 cm in short axis and is favored reactive. Unremarkable esophagus. Lungs/Pleura: Mild bronchial wall thickening and mucous plugging in the lower lungs. Interlobular septal thickening in the lung bases. Linear atelectasis/scarring in the lung bases. Ground-glass opacities in the posterior right lower lobe. Trace right pleural effusion. No pneumothorax. Upper Abdomen: Dilated IVC compatible with elevated right heart pressures. No acute abnormality in the abdomen. Musculoskeletal: Thoracic spondylosis.  No acute fracture. IMPRESSION: 1. Mild pulmonary edema with trace right pleural effusion. 2. Ground-glass opacities in the  posterior right lower lobe, which could be due to edema or infection/inflammation. 3. Bronchial wall thickening and mucous plugging in the lower lobes. 4. Cardiomegaly and dilated IVC compatible with elevated right heart pressures. 5. Enlarged main pulmonary artery, which can be seen in the setting of pulmonary hypertension. 6. Coronary artery atherosclerosis Aortic Atherosclerosis (ICD10-I70.0). Electronically Signed   By: Minerva Fester M.D.   On: 12/19/2022 00:58   DG Chest 2 View  Result Date: 12/18/2022 CLINICAL DATA:  Short of breath.  Cough. EXAM: CHEST - 2 VIEW COMPARISON:  05/06/2016. FINDINGS: Mild enlargement of the cardiac silhouette. No mediastinal or hilar masses. No evidence of adenopathy. Lungs are hyperexpanded. Streaky opacities noted in the medial lower lobes consistent with chronic bronchitic change and unchanged from the prior exam. No evidence of pneumonia or pulmonary edema. No pleural effusion or pneumothorax. Skeletal structures demineralized, grossly intact. IMPRESSION: 1. No acute cardiopulmonary disease. Electronically Signed   By: Amie Portland M.D.   On: 12/18/2022 12:48    Scheduled Meds:  albuterol  2.5 mg Nebulization Q4H   amLODipine  5 mg Oral Daily   apixaban  2.5 mg Oral BID   doxycycline  100 mg Oral Q12H   mometasone-formoterol  2 puff Inhalation BID   [  START ON 12/20/2022] predniSONE  40 mg Oral Q breakfast   sodium chloride flush  3 mL Intravenous Q12H   Continuous Infusions:  sodium chloride       LOS: 0 days   Time spent: 35 minutes   Marionna Gonia Estill Cotta, MD [facility]  If 7PM-7AM, please contact night-coverage [email or web address] 12/19/2022, 10:55 AM

## 2022-12-20 DIAGNOSIS — I5033 Acute on chronic diastolic (congestive) heart failure: Secondary | ICD-10-CM | POA: Diagnosis not present

## 2022-12-20 LAB — CBC
HCT: 27.3 % — ABNORMAL LOW (ref 36.0–46.0)
Hemoglobin: 9.5 g/dL — ABNORMAL LOW (ref 12.0–15.0)
MCH: 31.4 pg (ref 26.0–34.0)
MCHC: 34.8 g/dL (ref 30.0–36.0)
MCV: 90.1 fL (ref 80.0–100.0)
Platelets: 124 10*3/uL — ABNORMAL LOW (ref 150–400)
RBC: 3.03 MIL/uL — ABNORMAL LOW (ref 3.87–5.11)
RDW: 12.7 % (ref 11.5–15.5)
WBC: 3.5 10*3/uL — ABNORMAL LOW (ref 4.0–10.5)
nRBC: 0 % (ref 0.0–0.2)

## 2022-12-20 LAB — BASIC METABOLIC PANEL
Anion gap: 7 (ref 5–15)
Anion gap: 10 (ref 5–15)
BUN: 33 mg/dL — ABNORMAL HIGH (ref 8–23)
BUN: 33 mg/dL — ABNORMAL HIGH (ref 8–23)
CO2: 27 mmol/L (ref 22–32)
CO2: 28 mmol/L (ref 22–32)
Calcium: 8.1 mg/dL — ABNORMAL LOW (ref 8.9–10.3)
Calcium: 8.7 mg/dL — ABNORMAL LOW (ref 8.9–10.3)
Chloride: 84 mmol/L — ABNORMAL LOW (ref 98–111)
Chloride: 83 mmol/L — ABNORMAL LOW (ref 98–111)
Creatinine, Ser: 1.05 mg/dL — ABNORMAL HIGH (ref 0.44–1.00)
Creatinine, Ser: 1.06 mg/dL — ABNORMAL HIGH (ref 0.44–1.00)
GFR, Estimated: 49 mL/min — ABNORMAL LOW (ref >60)
GFR, Estimated: 48 mL/min — ABNORMAL LOW (ref >60)
Glucose, Bld: 163 mg/dL — ABNORMAL HIGH (ref 70–99)
Glucose, Bld: 142 mg/dL — ABNORMAL HIGH (ref 70–99)
Potassium: 4.2 mmol/L (ref 3.5–5.1)
Potassium: 4.5 mmol/L (ref 3.5–5.1)
Sodium: 118 mmol/L — CL (ref 135–145)
Sodium: 121 mmol/L — ABNORMAL LOW (ref 135–145)

## 2022-12-20 LAB — MAGNESIUM: Magnesium: 2.4 mg/dL (ref 1.7–2.4)

## 2022-12-20 LAB — LACTIC ACID, PLASMA: Lactic Acid, Venous: 1.5 mmol/L (ref 0.5–1.9)

## 2022-12-20 MED ORDER — IPRATROPIUM-ALBUTEROL 0.5-2.5 (3) MG/3ML IN SOLN: 3.0000 mL | RESPIRATORY_TRACT | Status: DC

## 2022-12-20 MED ORDER — GUAIFENESIN 100 MG/5ML PO LIQD
5.0000 mL | ORAL | Status: DC | PRN
  Administered 2022-12-22 – 2022-12-24 (×2): 5 mL via ORAL
  Filled 2022-12-20 (×2): qty 5

## 2022-12-20 MED ORDER — DOCUSATE SODIUM 100 MG PO CAPS
100.0000 mg | ORAL_CAPSULE | Freq: Two times a day (BID) | ORAL | Status: DC
  Administered 2022-12-20 – 2022-12-24 (×9): 100 mg via ORAL
  Filled 2022-12-20 (×9): qty 1

## 2022-12-20 MED ORDER — ALBUTEROL SULFATE (2.5 MG/3ML) 0.083% IN NEBU: 2.5000 mg | INHALATION_SOLUTION | RESPIRATORY_TRACT | Status: DC | PRN

## 2022-12-20 MED ORDER — FUROSEMIDE 10 MG/ML IJ SOLN
40.0000 mg | Freq: Every day | INTRAMUSCULAR | Status: DC
  Administered 2022-12-20 – 2022-12-22 (×3): 40 mg via INTRAVENOUS
  Filled 2022-12-20 (×3): qty 4

## 2022-12-20 MED ORDER — BISACODYL 10 MG RE SUPP
10.0000 mg | Freq: Every day | RECTAL | Status: DC | PRN
  Administered 2022-12-21 – 2022-12-23 (×2): 10 mg via RECTAL
  Filled 2022-12-20 (×2): qty 1

## 2022-12-20 NOTE — Progress Notes (Signed)
PROGRESS NOTE    Karina Chase  SPQ:[id number] DOB: [birth year] DOA: 12/18/2022 PCP: Johny Blamer, MD   Brief Narrative:  She is a pleasant 89 year old female with past medical history of persistent A-fib, on chronic anticoagulation, hypertensive heart disease with chronic diastolic CHF, CKD stage III, asthma present here with complaining of shortness of breath.  Her cardiologist recently increase Lasix to twice daily back in March.  She presented with cough, shortness of breath since 1 week.  She was given doxycycline and prednisone by her PCP however symptoms progressively worse therefore she came to the ER for further eval and management.  Ground-glass opacities in the posterior right lobe which could be due to edema or infection/inflammation.  Bronchial wall thickening and mucous plugging in lower lobes.  Cardiomegaly and dilated IVC.  Enlargement pulmonary artery seen and pulmonary hypertension.  Patient was given a dose of Lasix and admitted for further eval and management of CHF exacerbation.  Assessment & Plan:   Acute on chronic diastolic CHF: -Presented with worsening cough and shortness of breath.  BNP elevated at 599.  Reviewed CT chest as above.  Patient received Lasix in ED - Lasix 40 IV- strict I/Os -Echo is with preserved EF and unable to estimate diastolic function-- similar to prior  Acute asthma exacerbation: -Tested negative for COVID, flu and RSV. -NP swab -Solu-Medrol IV once given today.  Started on prednisone starting tomorrow.  -scheduled nebs -NP swab to r/o other viruses -d/c doxy as pro calcitonin negative  Persistent A-fib: -Continue Eliquis.  Not on any rate control medicines at home  Elevated troponin: Patient denies ACS symptoms.  Likely demand ischemia.  Will trend.  -Hypertensive heart disease: -Stable.  Continue amlodipine  AKI on CKD stage IIIa: -Baseline creatinine: 1.1, trended up to 1.3 on admission. 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  -Low serum osmolality, urine sodium about 40, urine osmolality above 100 -fluid restrict-- on 4/13 told to drink a lot of water-- BMP now and in AM  Normocytic anemia: Will continue to monitor.  -daily labs  Thrombocytopenia: Platelet 141.  Will continue to monitor  DVT prophylaxis: Eliquis Code Status: DNR Family Communication: Patient is daughter present at bedside.  Plan of care discussed with patient in length and she verbalized understanding and agreed with it. Plan of care discussed with patient daughter on the phone and she verbalized understanding Disposition Plan: PT eval placed, patient lives alone  Consultants:  None   Subjective: C/o cough  Objective: Vitals:   12/20/22 0521 12/20/22 0833 12/20/22 0837 12/20/22 0849  BP: (!) 155/62   (!) 166/84  Pulse: 91   92  Resp: 18   16  Temp: 97.8 F (36.6 C)   98.1 F (36.7 C)  TempSrc:    Oral  SpO2: (!) 89% 93% 92% 93%  Weight:        Intake/Output Summary (Last 24 hours) at 12/20/2022 0949 Last data filed at 12/20/2022 0745 Gross per 24 hour  Intake 360 ml  Output 750 ml  Net -390 ml   Filed Weights   12/19/22 0145 12/20/22 0500  Weight: 70 kg 70.2 kg    Examination:   General: Appearance:     Overweight female in no acute distress     Lungs:     Rhonchi, few wheezes, respirations unlabored  Heart:    Normal heart rate.   MS:   All extremities are intact.   Neurologic:   Awake,  alert, anxious-- hard of hearing      Data Reviewed: I have personally reviewed following labs and imaging studies  CBC: Recent Labs  Lab 12/18/22 1843 12/18/22 1934 12/19/22 1107 12/20/22 0444  WBC  --  4.5 3.9* 3.5*  NEUTROABS  --  3.4  --   --   HGB 11.2* 10.0* 10.2* 9.5*  HCT 33.0* 30.2* 30.5* 27.3*  MCV  --  93.5 92.1 90.1  PLT  --  141* 141* 124*   Basic Metabolic Panel: Recent Labs  Lab 12/18/22 1843 12/18/22 1934 12/19/22 1107 12/19/22 1427 12/19/22 2124  12/20/22 0444  NA 122* 120* 122* 122* 121* 118*  K 4.4 4.3 3.9  --   --  4.2  CL 86* 84* 82*  --   --  84*  CO2  --  25 27  --   --  27  GLUCOSE 118* 114* 177*  --   --  163*  BUN 30* 27* 32*  --   --  33*  CREATININE 1.30* 1.32* 1.21*  --   --  1.05*  CALCIUM  --  8.3* 8.6*  --   --  8.1*  MG  --   --  1.7  --   --  2.4   GFR: Estimated Creatinine Clearance: 30.1 mL/min (A) (by C-G formula based on SCr of 1.05 mg/dL (H)). Liver Function Tests: Recent Labs  Lab 12/18/22 1934  AST 46*  ALT 35  ALKPHOS 78  BILITOT 0.7  PROT 7.0  ALBUMIN 3.3*   No results for input(s): "LIPASE", "AMYLASE" in the last 168 hours. No results for input(s): "AMMONIA" in the last 168 hours. Coagulation Profile: No results for input(s): "INR", "PROTIME" in the last 168 hours. Cardiac Enzymes: No results for input(s): "CKTOTAL", "CKMB", "CKMBINDEX", "TROPONINI" in the last 168 hours. BNP (last 3 results) No results for input(s): "PROBNP" in the last 8760 hours. HbA1C: No results for input(s): "HGBA1C" in the last 72 hours. CBG: No results for input(s): "GLUCAP" in the last 168 hours. Lipid Profile: No results for input(s): "CHOL", "HDL", "LDLCALC", "TRIG", "CHOLHDL", "LDLDIRECT" in the last 72 hours. Thyroid Function Tests: No results for input(s): "TSH", "T4TOTAL", "FREET4", "T3FREE", "THYROIDAB" in the last 72 hours. Anemia Panel: No results for input(s): "VITAMINB12", "FOLATE", "FERRITIN", "TIBC", "IRON", "RETICCTPCT" in the last 72 hours. Sepsis Labs: Recent Labs  Lab 12/18/22 1828 12/18/22 1958 12/19/22 1107 12/19/22 1427  PROCALCITON  --   --  <0.10  --   LATICACIDVEN 0.8 1.0 1.7 2.4*    Recent Results (from the past 240 hour(s))  Resp panel by RT-PCR (RSV, Flu A&B, Covid) Anterior Nasal Swab     Status: None   Collection Time: 12/18/22  8:06 PM   Specimen: Anterior Nasal Swab  Result Value Ref Range Status   SARS Coronavirus 2 by RT PCR NEGATIVE NEGATIVE Final   Influenza A by  PCR NEGATIVE NEGATIVE Final   Influenza B by PCR NEGATIVE NEGATIVE Final    Comment: (NOTE) The Xpert Xpress SARS-CoV-2/FLU/RSV plus assay is intended as an aid in the diagnosis of influenza from Nasopharyngeal swab specimens and should not be used as a sole basis for treatment. Nasal washings and aspirates are unacceptable for Xpert Xpress SARS-CoV-2/FLU/RSV testing.  Fact Sheet for Patients: BloggerCourse.com  Fact Sheet for Healthcare Providers: SeriousBroker.it  This test is not yet approved or cleared by the [place] FDA and has been authorized for detection and/or diagnosis of SARS-CoV-2 by FDA under an Emergency  Use Authorization (EUA). This EUA will remain in effect (meaning this test can be used) for the duration of the COVID-19 declaration under Section 564(b)(1) of the Act, 21 U.S.C. section 360bbb-3(b)(1), unless the authorization is terminated or revoked.     Resp Syncytial Virus by PCR NEGATIVE NEGATIVE Final    Comment: (NOTE) Fact Sheet for Patients: BloggerCourse.com  Fact Sheet for Healthcare Providers: SeriousBroker.it  This test is not yet approved or cleared by the [place] FDA and has been authorized for detection and/or diagnosis of SARS-CoV-2 by FDA under an Emergency Use Authorization (EUA). This EUA will remain in effect (meaning this test can be used) for the duration of the COVID-19 declaration under Section 564(b)(1) of the Act, 21 U.S.C. section 360bbb-3(b)(1), unless the authorization is terminated or revoked.  Performed at [facility], [address], [place], [place] [address]       Radiology Studies: ECHOCARDIOGRAM COMPLETE  Result Date: 12/19/2022    ECHOCARDIOGRAM REPORT   Patient Name:   GENNY CAULDER Date of Exam: 12/19/2022 Medical Rec #:  [id number]        Height:       63.0 in Accession #:    [id number]        Weight:       154.3 lb Date of Birth:  May 13, 1925        BSA:          1.732 m Patient Age:    89 years         BP:           155/77 mmHg Patient Gender: F                HR:           81 bpm. Exam Location:  Inpatient Procedure: 2D Echo, Cardiac Doppler and Color Doppler Indications:    A flutter  History:        Patient has prior history of Echocardiogram examinations, most                 recent 06/20/2020. CHF, COPD, Signs/Symptoms:Chest Pain; Risk                 Factors:Hypertension.  Sonographer:    Melissa Morford RDCS (AE, PE) Referring Phys: [id number] JARED M GARDNER IMPRESSIONS  1. Left ventricular ejection fraction, by estimation, is 60 to 65%. The left ventricle has normal function. The left ventricle has no regional wall motion abnormalities. There is mild left ventricular hypertrophy. Left ventricular diastolic function could not be evaluated.  2. Right ventricular systolic function is mildly reduced. The right ventricular size is normal. There is moderately elevated pulmonary artery systolic pressure. The estimated right ventricular systolic pressure is 53.4 mmHg.  3. Left atrial size was mildly dilated.  4. Right atrial size was moderately dilated.  5. The mitral valve is normal in structure. Mild mitral valve regurgitation. No evidence of mitral stenosis.  6. The tricuspid valve is abnormal. Tricuspid valve regurgitation is moderate.  7. The aortic valve is tricuspid. Aortic valve regurgitation is not visualized. No aortic stenosis is present.  8. The inferior vena cava is normal in size with <50% respiratory variability, suggesting right atrial pressure of 8 mmHg. Comparison(s): No significant change from prior study. FINDINGS  Left Ventricle: Left ventricular ejection fraction, by estimation, is 60 to 65%. The left ventricle has normal function. The left ventricle has no regional wall motion abnormalities. The left ventricular internal cavity size  was normal in size. There is  mild left ventricular  hypertrophy. Left ventricular diastolic function could not be evaluated due to atrial fibrillation. Left ventricular diastolic function could not be evaluated. Right Ventricle: The right ventricular size is normal. No increase in right ventricular wall thickness. Right ventricular systolic function is mildly reduced. There is moderately elevated pulmonary artery systolic pressure. The tricuspid regurgitant velocity is 3.37 m/s, and with an assumed right atrial pressure of 8 mmHg, the estimated right ventricular systolic pressure is 53.4 mmHg. Left Atrium: Left atrial size was mildly dilated. Right Atrium: Right atrial size was moderately dilated. Pericardium: There is no evidence of pericardial effusion. Mitral Valve: The mitral valve is normal in structure. Mild mitral valve regurgitation. No evidence of mitral valve stenosis. Tricuspid Valve: The tricuspid valve is abnormal. Tricuspid valve regurgitation is moderate . No evidence of tricuspid stenosis. Aortic Valve: The aortic valve is tricuspid. Aortic valve regurgitation is not visualized. No aortic stenosis is present. Pulmonic Valve: The pulmonic valve was normal in structure. Pulmonic valve regurgitation is not visualized. No evidence of pulmonic stenosis. Aorta: The aortic root and ascending aorta are structurally normal, with no evidence of dilitation. Venous: The inferior vena cava is normal in size with less than 50% respiratory variability, suggesting right atrial pressure of 8 mmHg. IAS/Shunts: No atrial level shunt detected by color flow Doppler.  LEFT VENTRICLE PLAX 2D LVIDd:         3.80 cm LVIDs:         2.80 cm LV PW:         1.20 cm LV IVS:        1.10 cm LVOT diam:     1.90 cm LV SV:         47 LV SV Index:   27 LVOT Area:     2.84 cm  LV Volumes (MOD) LV vol d, MOD A2C: 84.7 ml LV vol d, MOD A4C: 58.5 ml LV vol s, MOD A2C: 35.0 ml LV vol s, MOD A4C: 35.2 ml LV SV MOD A2C:     49.7 ml LV SV MOD A4C:     58.5 ml LV SV MOD BP:      34.5 ml RIGHT  VENTRICLE RV S prime:     9.90 cm/s TAPSE (M-mode): 1.6 cm LEFT ATRIUM             Index        RIGHT ATRIUM           Index LA diam:        4.10 cm 2.37 cm/m   RA Area:     18.00 cm LA Vol (A2C):   81.8 ml 47.23 ml/m  RA Volume:   47.30 ml  27.31 ml/m LA Vol (A4C):   49.0 ml 28.29 ml/m LA Biplane Vol: 63.3 ml 36.55 ml/m  AORTIC VALVE LVOT Vmax:   81.30 cm/s LVOT Vmean:  64.600 cm/s LVOT VTI:    0.165 m  AORTA Ao Root diam: 3.40 cm Ao Asc diam:  3.40 cm MITRAL VALVE                TRICUSPID VALVE MV Area (PHT): 4.33 cm     TR Peak grad:   45.4 mmHg MV Decel Time: 175 msec     TR Vmax:        337.00 cm/s MV E velocity: 112.00 cm/s  SHUNTS                             Systemic VTI:  0.16 m                             Systemic Diam: 1.90 cm Vishnu Priya Mallipeddi Electronically signed by Winfield Rast Mallipeddi Signature Date/Time: 12/19/2022/3:32:05 PM    Final    CT Chest Wo Contrast  Result Date: 12/19/2022 CLINICAL DATA:  Shortness of breath, cough, history of asthma. Pneumonia complication suspected EXAM: CT CHEST WITHOUT CONTRAST TECHNIQUE: Multidetector CT imaging of the chest was performed following the standard protocol without IV contrast. RADIATION DOSE REDUCTION: This exam was performed according to the departmental dose-optimization program which includes automated exposure control, adjustment of the mA and/or kV according to patient size and/or use of iterative reconstruction technique. COMPARISON:  Radiographs 12/18/2022 FINDINGS: Cardiovascular: Cardiomegaly. Coronary artery and aortic atherosclerotic calcification. No pericardial effusion. The main pulmonary artery measuring 34 mm in diameter. Mediastinum/Nodes: Mediastinal and hilar adenopathy are suboptimally assessed without IV contrast. Prominent precarinal lymph node measures 1.4 cm in short axis and is favored reactive. Unremarkable esophagus. Lungs/Pleura: Mild bronchial wall thickening and mucous plugging in  the lower lungs. Interlobular septal thickening in the lung bases. Linear atelectasis/scarring in the lung bases. Ground-glass opacities in the posterior right lower lobe. Trace right pleural effusion. No pneumothorax. Upper Abdomen: Dilated IVC compatible with elevated right heart pressures. No acute abnormality in the abdomen. Musculoskeletal: Thoracic spondylosis.  No acute fracture. IMPRESSION: 1. Mild pulmonary edema with trace right pleural effusion. 2. Ground-glass opacities in the posterior right lower lobe, which could be due to edema or infection/inflammation. 3. Bronchial wall thickening and mucous plugging in the lower lobes. 4. Cardiomegaly and dilated IVC compatible with elevated right heart pressures. 5. Enlarged main pulmonary artery, which can be seen in the setting of pulmonary hypertension. 6. Coronary artery atherosclerosis Aortic Atherosclerosis (ICD10-I70.0). Electronically Signed   By: Minerva Fester M.D.   On: 12/19/2022 00:58   DG Chest 2 View  Result Date: 12/18/2022 CLINICAL DATA:  Short of breath.  Cough. EXAM: CHEST - 2 VIEW COMPARISON:  05/06/2016. FINDINGS: Mild enlargement of the cardiac silhouette. No mediastinal or hilar masses. No evidence of adenopathy. Lungs are hyperexpanded. Streaky opacities noted in the medial lower lobes consistent with chronic bronchitic change and unchanged from the prior exam. No evidence of pneumonia or pulmonary edema. No pleural effusion or pneumothorax. Skeletal structures demineralized, grossly intact. IMPRESSION: 1. No acute cardiopulmonary disease. Electronically Signed   By: Amie Portland M.D.   On: 12/18/2022 12:48    Scheduled Meds:  amLODipine  5 mg Oral Daily   apixaban  2.5 mg Oral BID   furosemide  40 mg Intravenous Daily   ipratropium-albuterol  3 mL Nebulization Q4H   mometasone-formoterol  2 puff Inhalation BID   predniSONE  40 mg Oral Q breakfast   sodium chloride flush  3 mL Intravenous Q12H   Continuous Infusions:   sodium chloride       LOS: 1 day   Time spent: 35 minutes   Joseph Art, DO [facility]  If 7PM-7AM, please contact night-coverage [email or web address] 12/20/2022, 9:49 AM

## 2022-12-21 ENCOUNTER — Inpatient Hospital Stay (HOSPITAL_COMMUNITY): Payer: Medicare Other

## 2022-12-21 DIAGNOSIS — I5033 Acute on chronic diastolic (congestive) heart failure: Secondary | ICD-10-CM | POA: Diagnosis not present

## 2022-12-21 LAB — RESPIRATORY PANEL BY PCR
Bordetella Parapertussis: NOT DETECTED
Chlamydophila pneumoniae: NOT DETECTED
Coronavirus 229E: NOT DETECTED
Coronavirus HKU1: NOT DETECTED
Influenza B: NOT DETECTED
Metapneumovirus: NOT DETECTED
Mycoplasma pneumoniae: NOT DETECTED
Parainfluenza Virus 4: NOT DETECTED
Respiratory Syncytial Virus: NOT DETECTED
Rhinovirus / Enterovirus: NOT DETECTED

## 2022-12-21 LAB — SODIUM
Sodium: 118 mmol/L — CL (ref 135–145)
Sodium: 117 mmol/L — CL (ref 135–145)
Sodium: 120 mmol/L — ABNORMAL LOW (ref 135–145)

## 2022-12-21 LAB — CBC
HCT: 29.5 % — ABNORMAL LOW (ref 36.0–46.0)
Hemoglobin: 10.4 g/dL — ABNORMAL LOW (ref 12.0–15.0)
MCH: 31.2 pg (ref 26.0–34.0)
MCHC: 35.3 g/dL (ref 30.0–36.0)
MCV: 88.6 fL (ref 80.0–100.0)
Platelets: 164 10*3/uL (ref 150–400)
RBC: 3.33 MIL/uL — ABNORMAL LOW (ref 3.87–5.11)
RDW: 12.8 % (ref 11.5–15.5)
WBC: 7.6 10*3/uL (ref 4.0–10.5)
nRBC: 0 % (ref 0.0–0.2)

## 2022-12-21 LAB — BASIC METABOLIC PANEL
Anion gap: 9 (ref 5–15)
BUN: 35 mg/dL — ABNORMAL HIGH (ref 8–23)
CO2: 28 mmol/L (ref 22–32)
Calcium: 8.6 mg/dL — ABNORMAL LOW (ref 8.9–10.3)
Chloride: 81 mmol/L — ABNORMAL LOW (ref 98–111)
Creatinine, Ser: 1.09 mg/dL — ABNORMAL HIGH (ref 0.44–1.00)
GFR, Estimated: 47 mL/min — ABNORMAL LOW (ref >60)
Glucose, Bld: 133 mg/dL — ABNORMAL HIGH (ref 70–99)
Potassium: 4.6 mmol/L (ref 3.5–5.1)
Sodium: 118 mmol/L — CL (ref 135–145)

## 2022-12-21 LAB — SODIUM, URINE, RANDOM: Sodium, Ur: 35 mmol/L

## 2022-12-21 LAB — OSMOLALITY, URINE: Osmolality, Ur: 204 mOsm/kg — ABNORMAL LOW (ref 300–900)

## 2022-12-21 LAB — TSH: TSH: 2.845 u[IU]/mL (ref 0.350–4.500)

## 2022-12-21 LAB — OSMOLALITY: Osmolality: 275 mOsm/kg (ref 275–295)

## 2022-12-21 MED ORDER — PREDNISONE 20 MG PO TABS
20.0000 mg | ORAL_TABLET | Freq: Every day | ORAL | Status: AC
  Administered 2022-12-22 – 2022-12-24 (×3): 20 mg via ORAL
  Filled 2022-12-21 (×3): qty 1

## 2022-12-21 MED ORDER — UREA 15 G PO PACK
15.0000 g | PACK | Freq: Two times a day (BID) | ORAL | Status: DC
  Administered 2022-12-21 – 2022-12-22 (×3): 15 g via ORAL
  Filled 2022-12-21 (×3): qty 1

## 2022-12-21 NOTE — Evaluation (Signed)
Physical Therapy Evaluation Patient Details Name: Karina Chase MRN: [id number] DOB: 1925/05/28 Today's Date: 12/21/2022  History of Present Illness  Pt is 89 yo female presenting to [facility] with reports of shortness of breath. PMH: A-fib, HTN, chronic diastolic CHF, CKD III, Asthma.  Clinical Impression  Pt is presenting below baseline. Pt was previously independent with mobility and ADL's. Currently pt is CGA for sit to stand and gait. Pt and family are hoping that pt will be able to go to rehab in order to work on strength, balance and functional mobility to return to prior level of functional mobility and independence. Will continue to work with pt while in acute care setting on functional mobility to decrease risk for falls, injury on discharge. Pt will require supervision on discharge due to risk for falls.        Recommendations for follow up therapy are one component of a multi-disciplinary discharge planning process, led by the attending physician.  Recommendations may be updated based on patient status, additional functional criteria and insurance authorization.  Follow Up Recommendations       Assistance Recommended at Discharge Set up Supervision/Assistance  Patient can return home with the following  A little help with walking and/or transfers;Assist for transportation;Assistance with cooking/housework;Help with stairs or ramp for entrance    Equipment Recommendations Rolling walker (2 wheels)  Recommendations for Other Services  OT consult    Functional Status Assessment Patient has had a recent decline in their functional status and demonstrates the ability to make significant improvements in function in a reasonable and predictable amount of time.     Precautions / Restrictions Precautions Precautions: Fall Restrictions Weight Bearing Restrictions: No      Mobility  Bed Mobility Overal bed mobility: Modified Independent             General bed mobility  comments: slight increase in time no physical assistance required. Patient Response: Cooperative  Transfers Overall transfer level: Needs assistance Equipment used: 1 person hand held assist Transfers: Sit to/from Stand Sit to Stand: Min guard           General transfer comment: Pt was min guard for stability with sit to stand from EOB and toilet.    Ambulation/Gait Ambulation/Gait assistance: Min guard Gait Distance (Feet): 30 Feet Assistive device: 1 person hand held assist Gait Pattern/deviations: Step-through pattern, WFL(Within Functional Limits) Gait velocity: slightly decreased Gait velocity interpretation: 1.31 - 2.62 ft/sec, indicative of limited community ambulator   General Gait Details: slight multi directional sway, HHA no overt LOB, Pt ambulated 15 ft to toilet and then 15 ft back to bed.       Balance Overall balance assessment: Mild deficits observed, not formally tested               Pertinent Vitals/Pain Pain Assessment Pain Assessment: No/denies pain    Home Living Family/patient expects to be discharged to:: Private residence Living Arrangements: Alone (daughter lives behind her and son lives 15-20 min away. They cannot provide 24/hour assistance.) Available Help at Discharge: Family;Available PRN/intermittently Type of Home: House         Home Layout: One level Home Equipment: Cane - quad;Cane - single point;BSC/3in1 Additional Comments: pt states they are working on getting her a grab bar in bathroom.    Prior Function Prior Level of Function : Independent/Modified Independent             Mobility Comments: ambulated without AD. ADLs Comments: Modified independent  Hand Dominance   Dominant Hand: Right    Extremity/Trunk Assessment   Upper Extremity Assessment Upper Extremity Assessment: Overall WFL for tasks assessed    Lower Extremity Assessment Lower Extremity Assessment: Overall WFL for tasks assessed     Cervical / Trunk Assessment Cervical / Trunk Assessment: Normal  Communication   Communication: HOH  Cognition Arousal/Alertness: Awake/alert Behavior During Therapy: WFL for tasks assessed/performed Overall Cognitive Status: Difficult to assess     General Comments: daughter states that pt has some memory issues. Pt stated she had not had a BM and RN stated pt had one earlier. Daughter states this is slightly different than baseline.               Assessment/Plan    PT Assessment Patient needs continued PT services  PT Problem List Decreased strength;Decreased mobility;Decreased activity tolerance;Decreased balance       PT Treatment Interventions DME instruction;Therapeutic activities;Cognitive remediation;Gait training;Therapeutic exercise;Patient/family education;Stair training;Balance training;Functional mobility training;Neuromuscular re-education    PT Goals (Current goals can be found in the Care Plan section)  Acute Rehab PT Goals Patient Stated Goal: Pt would like to go to rehab PT Goal Formulation: With patient/family Time For Goal Achievement: 01/04/23 Potential to Achieve Goals: Fair    Frequency Min 3X/week        AM-PAC PT "6 Clicks" Mobility  Outcome Measure Help needed turning from your back to your side while in a flat bed without using bedrails?: None Help needed moving from lying on your back to sitting on the side of a flat bed without using bedrails?: None Help needed moving to and from a bed to a chair (including a wheelchair)?: A Little Help needed standing up from a chair using your arms (e.g., wheelchair or bedside chair)?: A Little Help needed to walk in hospital room?: A Little Help needed climbing 3-5 steps with a railing? : A Little 6 Click Score: 20    End of Session   Activity Tolerance: Patient tolerated treatment well Patient left: in bed;with call bell/phone within reach;with family/visitor present Nurse Communication:  Mobility status PT Visit Diagnosis: Unsteadiness on feet (R26.81);Other abnormalities of gait and mobility (R26.89)    Time: 9528-4132 PT Time Calculation (min) (ACUTE ONLY): 30 min   Charges:   PT Evaluation $PT Eval Low Complexity: 1 Low PT Treatments $Therapeutic Activity: 8-22 mins        Harrel Carina, DPT, CLT  Acute Rehabilitation Services Office: [phone] (Secure chat preferred)   Claudia Desanctis 12/21/2022, 2:26 PM

## 2022-12-21 NOTE — Social Work (Signed)
CSW notified by PT that pt is currently not needing SNF, but family would like to discuss other placement options. CSW spoke with pt's dtr who stated pt lives alone, and family cannot be there 24/7. She is concerned about pt not being independent and a decline in her mentation. CSW reviewed PT note with dtr, noting that she is independent/ modified independent with contact guard for most activities, which means she is above the level to qualify for SNF placement. CSW and dtr discussed other options including: Home w/ HH with family assistance or paid caregivers. ALF placement, [facility] resource provided.  Seeing if pt qualifies for SNF closer to [place].  Medicare coverage discussed with dtr. Dtr states she will discuss options with family and will let CSW know about decision. CSW advised for family to call with any other questions or concerns. TOC will continue to follow for [place] needs.

## 2022-12-21 NOTE — Consult Note (Signed)
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 Possibly related to respiratory issues? Euvolemic on exam -receiving lasix 40mg  IV daily now -fluid restrict 1.2L/day -monitor Na q4-6h -avoiding overcorrection, no more than 6-8 mmol rise in Na in 24 hrs. Repeat is currently pending -check TSH -would be hesitant on using salt tabs given acute diastolic CHF, will start ure-na 15g bid if no significant improvement with Na despite fluid restriction (pending most recent labs) -hard to say if her symptoms are related to steroids or hyponatremia but if Na worsens then would advocate for 3% (would need to move to ICU for this), daughter agrees  AKI on CKD 3 -baseline Cr around 1.1 -Cr back to baseline -daily labs, strict I/O  Acute on chronic diastolic CHF exacerbation -repeat CXR 4/15 better. Diuresis with lasix 40mg  IV daily  Acute asthma exacerbation -receiving prednisone -per primary service  Afib -on eliquis, rate controlled, not receiving any rate controlling meds  HTN -BP slightly higher than expected, possibly related to steroids? If persistently high, then can increase amlodipine to 10mg  daily  Anemia -mild -per primary service   Recommendations conveyed to primary service.    Shela Esses [facility] 12/21/2022 11:59 AM   _____________________________________________________________________________________   History of Present Illness: Karina Chase is a/an 89 y.o. female with a past medical history of persistent A-fib, hypertension, chronic diastolic CHF, CKD 3, asthma who presents to [facility] with shortness of breath.  Her cardiologist recently increased her Lasix to twice daily in March.  For about 1 week prior to admission she has been having cough and shortness of breath.  She did see her PCP about this and was prescribed prednisone and doxycycline however symptoms worsened.  She did  undergo a CT chest while she was here and was found to have mild pulmonary edema with trace right pleural effusion as well as groundglass opacities in the posterior right lower lobe which could be related to edema or infection/inflammation.  She did have mucous plugging in the lower lobes on imaging.  She was treated with Lasix IV, also started on steroids and antibiotics.  Antibiotics were discontinued as her procalcitonin was negative. She was found to have a sodium of 122 on presentation and did drop to 118 on 4/14.  Repeat labs that day showed improvement in her sodium up to 121 but then dropped again on 4/15 around 1 AM, and back down to 118. She is currently on 1.2L/day fluid restriction. Patient seen and examined bedside. Daughter at bedside. Patient does report that sometimes she is seeing 'nets' or fogginess but seems to have started when started on steroids. She does report some dysphagia. Fluid restriction issue was cleared up, was drinking a lot of water a day or so ago. She does report ongoing coughing. Swelling has improved. Otherwise denies any chest pain, dizziness, changes with vision, dizziness, n/v.   Medications:  Current Facility-Administered Medications  Medication Dose Route Frequency Provider Last Rate Last Admin   0.9 %  sodium chloride infusion  250 mL Intravenous PRN Hillary Bow, DO       acetaminophen (TYLENOL) tablet 650 mg  650 mg Oral Q4H PRN Hillary Bow, DO       albuterol (PROVENTIL) (2.5 MG/3ML) 0.083% nebulizer solution 2.5 mg  2.5 mg Nebulization Q2H PRN Vann, Jessica U, DO       amLODipine (NORVASC) tablet 5 mg  5 mg Oral Daily Hillary Bow, DO  5 mg at 12/21/22 1008   apixaban (ELIQUIS) tablet 2.5 mg  2.5 mg Oral BID Hillary Bow, DO   2.5 mg at 12/21/22 1009   benzonatate (TESSALON) capsule 200 mg  200 mg Oral TID PRN Hillary Bow, DO   200 mg at 12/20/22 2049   bisacodyl (DULCOLAX) suppository 10 mg  10 mg Rectal Daily PRN Joseph Art,  DO   10 mg at 12/21/22 1009   docusate sodium (COLACE) capsule 100 mg  100 mg Oral BID Vann, Jessica U, DO   100 mg at 12/21/22 1009   furosemide (LASIX) injection 40 mg  40 mg Intravenous Daily Marlin Canary U, DO   40 mg at 12/21/22 1008   guaiFENesin (ROBITUSSIN) 100 MG/5ML liquid 5 mL  5 mL Oral Q4H PRN Joseph Art, DO       mometasone-formoterol (DULERA) 200-5 MCG/ACT inhaler 2 puff  2 puff Inhalation BID Hillary Bow, DO   2 puff at 12/21/22 1610   nitroGLYCERIN (NITROSTAT) SL tablet 0.4 mg  0.4 mg Sublingual Q5 min PRN Pahwani, Rinka R, MD       ondansetron (ZOFRAN) injection 4 mg  4 mg Intravenous Q6H PRN Julian Reil, Jared M, DO       predniSONE (DELTASONE) tablet 40 mg  40 mg Oral Q breakfast Pahwani, Rinka R, MD   40 mg at 12/21/22 0856   sodium chloride flush (NS) 0.9 % injection 3 mL  3 mL Intravenous Q12H Lyda Perone M, DO   3 mL at 12/20/22 2049   sodium chloride flush (NS) 0.9 % injection 3 mL  3 mL Intravenous PRN Hillary Bow, DO         ALLERGIES Levofloxacin, Keflex [cephalexin], Prilosec [omeprazole], Sulfa antibiotics, Sulfamethoxazole-trimethoprim, Azithromycin, Penicillin g, and Penicillins  MEDICAL HISTORY Past Medical History:  Diagnosis Date   Arthritis    Asthma    Atrial fibrillation    intermittent   Breast cancer    Cancer    Basal cell skin cancer   Colon polyp    GERD (gastroesophageal reflux disease)    History of shingles    Hypertension    Hypertensive heart disease 05/30/2020   Right groin pain 03/28/2013   Stress incontinence    Varicose veins    Vertigo      SOCIAL HISTORY Social History   Socioeconomic History   Marital status: Married    Spouse name: Not on file   Number of children: Not on file   Years of education: Not on file   Highest education level: Not on file  Occupational History   Not on file  Tobacco Use   Smoking status: Never   Smokeless tobacco: Never  Substance and Sexual Activity   Alcohol use: No    Drug use: No   Sexual activity: Not Currently    Birth control/protection: Abstinence  Other Topics Concern   Not on file  Social History Narrative   Not on file   Social Determinants of Health   Financial Resource Strain: Not on file  Food Insecurity: Not on file  Transportation Needs: Not on file  Physical Activity: Not on file  Stress: Not on file  Social Connections: Not on file  Intimate Partner Violence: Not on file     FAMILY HISTORY Family History  Problem Relation Age of Onset   Stroke Mother    Heart disease Father    Cancer Maternal Grandmother  breast    Review of Systems: 12 systems reviewed Otherwise as per HPI, all other systems reviewed and negative  Physical Exam: Vitals:   12/21/22 0834 12/21/22 1008  BP:  (!) 162/79  Pulse: 68   Resp: 16   Temp:    SpO2: 92%    No intake/output data recorded.  Intake/Output Summary (Last 24 hours) at 12/21/2022 1159 Last data filed at 12/20/2022 1700 Gross per 24 hour  Intake --  Output 300 ml  Net -300 ml   General: well-appearing, no acute distress HEENT: anicteric sclera, oropharynx clear without lesions, MMM CV: regular rate, normal rhythm, +systolic murmur, no gallops, no rubs, no peripheral edema Lungs: decreased breath sound right>left base, no overt w/r/r/c, bl chest expansion Abd: soft, non-tender, non-distended Skin: no visible lesions or rashes Psych: alert, engaged, appropriate mood and affect Musculoskeletal: no obvious deformities Neuro: normal speech, no gross focal deficits, very hard of hearing   Test Results Reviewed Lab Results  Component Value Date   NA 118 (LL) 12/21/2022   K 4.6 12/21/2022   CL 81 (L) 12/21/2022   CO2 28 12/21/2022   BUN 35 (H) 12/21/2022   CREATININE 1.09 (H) 12/21/2022   CALCIUM 8.6 (L) 12/21/2022   ALBUMIN 3.3 (L) 12/18/2022     I have reviewed all relevant outside healthcare records related to the patient's kidney injury.

## 2022-12-21 NOTE — Hospital Course (Addendum)
89 year old female with past medical history of persistent A-fib, on chronic anticoagulation, hypertensive heart disease with chronic diastolic CHF, CKD stage III, asthma present here with complaining of shortness of breath.  Her cardiologist recently increased Lasix to twice daily back in March.  She presented with cough, shortness of breath since 1 week.  She was given doxycycline and prednisone by her PCP however symptoms progressively worse therefore she came to the ER for further eval and management. Her imaging showed: Ground-glass opacities in the posterior right lobe which could be due to edema or infection/inflammation.  Bronchial wall thickening and mucous plugging in lower lobes.  Cardiomegaly and dilated IVC.  Enlargement pulmonary artery seen and pulmonary hypertension.  Patient was given a dose of Lasix and admitted for further eval and management of CHF exacerbation. 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 Seen by nephrology 4/15 placed on URE-NA 15 g twice daily and serial monitoring of sodium and improving

## 2022-12-21 NOTE — Progress Notes (Signed)
Heart Failure Navigator Progress Note  Assessed for Heart & Vascular TOC clinic readiness.  Patient EF 60-65% , has a [facility] appointment on 01/04/2023 @ 10:15 am. .   Navigator will sign off at this time.   Rhae Hammock, BSN, Scientist, clinical (histocompatibility and immunogenetics) Only

## 2022-12-21 NOTE — Progress Notes (Addendum)
Labs reviewed. Will start ure-na 15g BID. Will repeat urine studies. Continue to monitor Na q4h. If any drop and/or new symptoms will need to consider transfer to ICU for 3%. Please call with any questions/concerns.  Anthony Sar, MD [facility]

## 2022-12-21 NOTE — Progress Notes (Signed)
Lab call with critical lab sodium 118. New orders place, please see MAR.

## 2022-12-21 NOTE — Progress Notes (Signed)
PROGRESS NOTE Karina Chase  WUJ:[id number] DOB: Jan 31, 1925 DOA: 12/18/2022 PCP: Baldo Daub, MD  Brief Narrative/Hospital Course: 89 year old female with past medical history of persistent A-fib, on chronic anticoagulation, hypertensive heart disease with chronic diastolic CHF, CKD stage III, asthma present here with complaining of shortness of breath.  Her cardiologist recently increased Lasix to twice daily back in March.  She presented with cough, shortness of breath since 1 week.  She was given doxycycline and prednisone by her PCP however symptoms progressively worse therefore she came to the ER for further eval and management. Her imaging showed: Ground-glass opacities in the posterior right lobe which could be due to edema or infection/inflammation.  Bronchial wall thickening and mucous plugging in lower lobes.  Cardiomegaly and dilated IVC.  Enlargement pulmonary artery seen and pulmonary hypertension.  Patient was given a dose of Lasix and admitted for further eval and management of CHF exacerbation.    Subjective: Seen and examined Daughter at bedside She coughed/choked on drinking water Has been having shortness of breath and orthopnea On RA. 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 Low serum osmolality, urine sodium about 40, urine osmolality above 100. Cont fluid restrict at 1200 cc- consulted nephro this am- rechecking Na.  Nephrology starting you ED-NA 50 mg twice daily  if no significant improvement with sodium despite fluid restriction may need 3% saline. Recent Labs  Lab 12/19/22 1427 12/19/22 2124 12/20/22 0444 12/20/22 1229 12/21/22 0054  NA 122* 121* 118* 121* 118*    Acute on chronic diastolic NFA:[id number] with worsening cough and shortness of breath.  BNP elevated at 599.CT chest done in ED. being managed with IV Lasix strict intake output Daily weight monitoring, echo with preserved EF unable to estimate diastolic function similar to prior.With hyponatremia challenging with ongoing diuresis.  Obtained CXR 4/15- shows rt basilar opacities increased.  Has net negative balance with improving weight. Net IO Since Admission: -310 mL [12/21/22 1324]  Filed Weights   12/19/22 0145 12/20/22 0500 12/21/22 0500  Weight: 70 kg 70.2 kg 69.1 kg     Acute asthma exacerbation: Tested negative for COVID flu and RSV respiratory virus panel pending had some wheezing, continue on oral steroids bronchodilators, doxy was discontinued since procalcitonin was negative.  Start to taper prednisone   Persistent A-fib: Rate controlled not on rate controlling agent, on Eliquis.   Elevated troponin: Patient denies ACS symptoms.  Likely demand ischemia.  Troponin was slightly elevated and flat   Hypertensive heart disease: Stable continue antihypertensive amlodipine   AKI on CKD stage IIIa: Baseline creatinine 1.1 down trended up to 1.3 and downtrending, tolerating Lasix Recent Labs    12/18/22 1843 12/18/22 1934 12/19/22 1107 12/20/22 0444 12/20/22 1229 12/21/22 0054  BUN 30* 27* 32* 33* 33* 35*  CREATININE 1.30* 1.32* 1.21* 1.05* 1.06* 1.09*    Normocytic anemia: hb is stable , cont to monitor   Thrombocytopenia: Platelet 141.monitor  Lactic acid is 2.4 on 4/13 resolved  Goals of care CODE STATUS DNR, patient has  been living independently, given advanced age, monitor closely at risk of decompensation and readmission  DVT prophylaxis: apixaban (ELIQUIS)  tablet 2.5 mg Start: 12/18/22 2245 Code Status:   Code Status: DNR Family Communication: plan of care discussed with patient/daughter at bedside. Patient status is:  inpatient  because of hyponatremia Level of care: Telemetry Medical   Dispo: The patient is from: home lives alone and independent            Anticipated disposition: TBD- daughter prefers SNF for short term PTOT on hold pending improvement in sodium Objective: Vitals last 24 hrs: Vitals:   12/21/22 0500 12/21/22 0504 12/21/22 0834 12/21/22 1008  BP:  (!) 156/68  (!) 162/79  Pulse:  65 68   Resp:  17 16   Temp:  97.7 F (36.5 C)    TempSrc:  Oral    SpO2:  (!) 89% 92%   Weight: 69.1 kg      Weight change: -1.1 kg  Physical Examination: General exam: alert awake, older than stated age HEENT:Oral mucosa moist, Ear/Nose WNL grossly Respiratory system: bilaterally diminished  BS, no use of accessory muscle Cardiovascular system: S1 & S2 +, No JVD. Gastrointestinal system: Abdomen soft,NT,ND, BS+ Nervous System:Alert, awake, moving extremities. Extremities: LE edema + b/l le,distal peripheral pulses palpable.  Skin: No rashes,no icterus. MSK: Normal muscle bulk,tone, power  Medications reviewed:  Scheduled Meds:  amLODipine  5 mg Oral Daily   apixaban  2.5 mg Oral BID   docusate sodium  100 mg Oral BID   furosemide  40 mg Intravenous Daily   mometasone-formoterol  2 puff Inhalation BID   predniSONE  40 mg Oral Q breakfast   sodium chloride flush  3 mL Intravenous Q12H   Continuous Infusions:  sodium chloride        Diet Order             DIET DYS 3 Room service appropriate? Yes with Assist; Fluid consistency: Thin; Fluid restriction: 1200 mL Fluid  Diet effective now                   Intake/Output Summary (Last 24 hours) at 12/21/2022 1324 Last data filed at 12/20/2022 1700 Gross per 24 hour  Intake --  Output 300 ml  Net -300 ml   Net IO Since Admission: -310 mL [12/21/22 1324]  Wt Readings  from Last 3 Encounters:  12/21/22 69.1 kg  10/14/22 63.5 kg  04/10/22 60.8 kg     Unresulted Labs (From admission, onward)     Start     Ordered   12/22/22 0500  CBC  Daily,   R      12/21/22 0947   12/21/22 1600  Sodium  Now then every 4 hours,   R (with TIMED occurrences)      12/21/22 1208   12/21/22 1203  TSH  Once,   R        12/21/22 1203   12/21/22 1115  Sodium  ONCE - STAT,   STAT        12/21/22 1114   12/20/22 0908  Respiratory (~20 pathogens) panel by PCR  (Respiratory panel by PCR (~20 pathogens, ~24 hr TAT)  w precautions)  Once,   R        12/20/22 0907   12/20/22 0500  Basic metabolic panel  Daily,   R     Comments: As Scheduled for 5 days    12/19/22 0146  Data Reviewed: I have personally reviewed following labs and imaging studies CBC: Recent Labs  Lab 12/18/22 1843 12/18/22 1934 12/19/22 1107 12/20/22 0444 12/21/22 0054  WBC  --  4.5 3.9* 3.5* 7.6  NEUTROABS  --  3.4  --   --   --   HGB 11.2* 10.0* 10.2* 9.5* 10.4*  HCT 33.0* 30.2* 30.5* 27.3* 29.5*  MCV  --  93.5 92.1 90.1 88.6  PLT  --  141* 141* 124* 164   Basic Metabolic Panel: Recent Labs  Lab 12/18/22 1934 12/19/22 1107 12/19/22 1427 12/19/22 2124 12/20/22 0444 12/20/22 1229 12/21/22 0054  NA 120* 122* 122* 121* 118* 121* 118*  K 4.3 3.9  --   --  4.2 4.5 4.6  CL 84* 82*  --   --  84* 83* 81*  CO2 25 27  --   --  27 28 28   GLUCOSE 114* 177*  --   --  163* 142* 133*  BUN 27* 32*  --   --  33* 33* 35*  CREATININE 1.32* 1.21*  --   --  1.05* 1.06* 1.09*  CALCIUM 8.3* 8.6*  --   --  8.1* 8.7* 8.6*  MG  --  1.7  --   --  2.4  --   --    GFR: Estimated Creatinine Clearance: 28.8 mL/min (A) (by C-G formula based on SCr of 1.09 mg/dL (H)). Liver Function Tests: Recent Labs  Lab 12/18/22 1934  AST 46*  ALT 35  ALKPHOS 78  BILITOT 0.7  PROT 7.0  ALBUMIN 3.3*  Sepsis Labs: Recent Labs  Lab 12/18/22 1958 12/19/22 1107 12/19/22 1427 12/20/22 0955  PROCALCITON  --   <0.10  --   --   LATICACIDVEN 1.0 1.7 2.4* 1.5    Recent Results (from the past 240 hour(s))  Resp panel by RT-PCR (RSV, Flu A&B, Covid) Anterior Nasal Swab     Status: None   Collection Time: 12/18/22  8:06 PM   Specimen: Anterior Nasal Swab  Result Value Ref Range Status   SARS Coronavirus 2 by RT PCR NEGATIVE NEGATIVE Final   Influenza A by PCR NEGATIVE NEGATIVE Final   Influenza B by PCR NEGATIVE NEGATIVE Final    Comment: (NOTE) The Xpert Xpress SARS-CoV-2/FLU/RSV plus assay is intended as an aid in the diagnosis of influenza from Nasopharyngeal swab specimens and should not be used as a sole basis for treatment. Nasal washings and aspirates are unacceptable for Xpert Xpress SARS-CoV-2/FLU/RSV testing.  Fact Sheet for Patients: BloggerCourse.com  Fact Sheet for Healthcare Providers: SeriousBroker.it  This test is not yet approved or cleared by the [place] FDA and has been authorized for detection and/or diagnosis of SARS-CoV-2 by FDA under an Emergency Use Authorization (EUA). This EUA will remain in effect (meaning this test can be used) for the duration of the COVID-19 declaration under Section 564(b)(1) of the Act, 21 U.S.C. section 360bbb-3(b)(1), unless the authorization is terminated or revoked.     Resp Syncytial Virus by PCR NEGATIVE NEGATIVE Final    Comment: (NOTE) Fact Sheet for Patients: BloggerCourse.com  Fact Sheet for Healthcare Providers: SeriousBroker.it  This test is not yet approved or cleared by the [place] FDA and has been authorized for detection and/or diagnosis of SARS-CoV-2 by FDA under an Emergency Use Authorization (EUA). This EUA will remain in effect (meaning this test can be used) for the duration of the COVID-19 declaration under Section 564(b)(1) of the Act, 21 U.S.C. section 360bbb-3(b)(1), unless  the authorization is  terminated or revoked.  Performed at [facility], [address] [place], [place] [address]     Antimicrobials: Anti-infectives (From admission, onward)    Start     Dose/Rate Route Frequency Ordered Stop   12/19/22 1145  doxycycline (VIBRA-TABS) tablet 100 mg  Status:  Discontinued        100 mg Oral Every 12 hours 12/19/22 1051 12/20/22 0850      Culture/Microbiology No results found for: "SDES", "SPECREQUEST", "CULT", "REPTSTATUS"  Other culture-see note  Radiology Studies: DG Chest Port 1 View  Result Date: 12/21/2022 CLINICAL DATA:  [id number] Congestive heart failure (CHF) [id number] EXAM: PORTABLE CHEST 1 VIEW COMPARISON:  Radiograph 12/18/2022 FINDINGS: Unchanged cardiomegaly. Bibasilar atelectasis. Trace right pleural effusion. Left peripheral basilar pleural thickening, unchanged. Increased right basilar airspace disease. No pneumothorax. Bones are unchanged. IMPRESSION: Increased right basilar airspace opacities, could be atelectasis or developing infection. Electronically Signed   By: Caprice Renshaw M.D.   On: 12/21/2022 09:55   ECHOCARDIOGRAM COMPLETE  Result Date: 12/19/2022    ECHOCARDIOGRAM REPORT   Patient Name:   DEVINN VOSHELL Date of Exam: 12/19/2022 Medical Rec #:  [id number]        Height:       63.0 in Accession #:    [id number]       Weight:       154.3 lb Date of Birth:  1924-12-06        BSA:          1.732 m Patient Age:    89 years         BP:           155/77 mmHg Patient Gender: F                HR:           81 bpm. Exam Location:  Inpatient Procedure: 2D Echo, Cardiac Doppler and Color Doppler Indications:    A flutter  History:        Patient has prior history of Echocardiogram examinations, most                 recent 06/20/2020. CHF, COPD, Signs/Symptoms:Chest Pain; Risk                 Factors:Hypertension.  Sonographer:    Melissa Morford RDCS (AE, PE) Referring Phys: [id number] JARED M GARDNER IMPRESSIONS  1. Left ventricular ejection fraction, by estimation, is  60 to 65%. The left ventricle has normal function. The left ventricle has no regional wall motion abnormalities. There is mild left ventricular hypertrophy. Left ventricular diastolic function could not be evaluated.  2. Right ventricular systolic function is mildly reduced. The right ventricular size is normal. There is moderately elevated pulmonary artery systolic pressure. The estimated right ventricular systolic pressure is 53.4 mmHg.  3. Left atrial size was mildly dilated.  4. Right atrial size was moderately dilated.  5. The mitral valve is normal in structure. Mild mitral valve regurgitation. No evidence of mitral stenosis.  6. The tricuspid valve is abnormal. Tricuspid valve regurgitation is moderate.  7. The aortic valve is tricuspid. Aortic valve regurgitation is not visualized. No aortic stenosis is present.  8. The inferior vena cava is normal in size with <50% respiratory variability, suggesting right atrial pressure of 8 mmHg. Comparison(s): No significant change from prior study. FINDINGS  Left Ventricle: Left ventricular ejection fraction, by estimation, is 60 to 65%. The left ventricle has  normal function. The left ventricle has no regional wall motion abnormalities. The left ventricular internal cavity size was normal in size. There is  mild left ventricular hypertrophy. Left ventricular diastolic function could not be evaluated due to atrial fibrillation. Left ventricular diastolic function could not be evaluated. Right Ventricle: The right ventricular size is normal. No increase in right ventricular wall thickness. Right ventricular systolic function is mildly reduced. There is moderately elevated pulmonary artery systolic pressure. The tricuspid regurgitant velocity is 3.37 m/s, and with an assumed right atrial pressure of 8 mmHg, the estimated right ventricular systolic pressure is 53.4 mmHg. Left Atrium: Left atrial size was mildly dilated. Right Atrium: Right atrial size was moderately  dilated. Pericardium: There is no evidence of pericardial effusion. Mitral Valve: The mitral valve is normal in structure. Mild mitral valve regurgitation. No evidence of mitral valve stenosis. Tricuspid Valve: The tricuspid valve is abnormal. Tricuspid valve regurgitation is moderate . No evidence of tricuspid stenosis. Aortic Valve: The aortic valve is tricuspid. Aortic valve regurgitation is not visualized. No aortic stenosis is present. Pulmonic Valve: The pulmonic valve was normal in structure. Pulmonic valve regurgitation is not visualized. No evidence of pulmonic stenosis. Aorta: The aortic root and ascending aorta are structurally normal, with no evidence of dilitation. Venous: The inferior vena cava is normal in size with less than 50% respiratory variability, suggesting right atrial pressure of 8 mmHg. IAS/Shunts: No atrial level shunt detected by color flow Doppler.  LEFT VENTRICLE PLAX 2D LVIDd:         3.80 cm LVIDs:         2.80 cm LV PW:         1.20 cm LV IVS:        1.10 cm LVOT diam:     1.90 cm LV SV:         47 LV SV Index:   27 LVOT Area:     2.84 cm  LV Volumes (MOD) LV vol d, MOD A2C: 84.7 ml LV vol d, MOD A4C: 58.5 ml LV vol s, MOD A2C: 35.0 ml LV vol s, MOD A4C: 35.2 ml LV SV MOD A2C:     49.7 ml LV SV MOD A4C:     58.5 ml LV SV MOD BP:      34.5 ml RIGHT VENTRICLE RV S prime:     9.90 cm/s TAPSE (M-mode): 1.6 cm LEFT ATRIUM             Index        RIGHT ATRIUM           Index LA diam:        4.10 cm 2.37 cm/m   RA Area:     18.00 cm LA Vol (A2C):   81.8 ml 47.23 ml/m  RA Volume:   47.30 ml  27.31 ml/m LA Vol (A4C):   49.0 ml 28.29 ml/m LA Biplane Vol: 63.3 ml 36.55 ml/m  AORTIC VALVE LVOT Vmax:   81.30 cm/s LVOT Vmean:  64.600 cm/s LVOT VTI:    0.165 m  AORTA Ao Root diam: 3.40 cm Ao Asc diam:  3.40 cm MITRAL VALVE                TRICUSPID VALVE MV Area (PHT): 4.33 cm     TR Peak grad:   45.4 mmHg MV Decel Time: 175 msec     TR Vmax:        337.00 cm/s MV E velocity: 112.00 cm/s  SHUNTS                             Systemic VTI:  0.16 m                             Systemic Diam: 1.90 cm Vishnu Priya Mallipeddi Electronically signed by Winfield Rast Mallipeddi Signature Date/Time: 12/19/2022/3:32:05 PM    Final      LOS: 2 days   Lanae Boast, MD [facility]  12/21/2022, 1:24 PM

## 2022-12-21 NOTE — Evaluation (Signed)
Clinical/Bedside Swallow Evaluation Patient Details  Name: Karina Chase MRN: [id number] Date of Birth: [birth year]  Today's Date: 12/21/2022 Time: SLP Start Time (ACUTE ONLY): 1143 SLP Stop Time (ACUTE ONLY): 1204 SLP Time Calculation (min) (ACUTE ONLY): 21 min  Past Medical History:  Past Medical History:  Diagnosis Date   Arthritis    Asthma    Atrial fibrillation    intermittent   Breast cancer    Cancer    Basal cell skin cancer   Colon polyp    GERD (gastroesophageal reflux disease)    History of shingles    Hypertension    Hypertensive heart disease 05/30/2020   Right groin pain 03/28/2013   Stress incontinence    Varicose veins    Vertigo    Past Surgical History:  Past Surgical History:  Procedure Laterality Date   ABDOMINAL HYSTERECTOMY  1972   BSO   BREAST SURGERY     lumpectomy   SKIN CANCER EXCISION  2014   HPI:  Pt is a 89 yo female presenting 4/12 with acute on chronic CHF and concern for PNA. CXR 4/15 with increasing R basilar airspace opacities. PMH includes: GERD, persistent A-fib, on chronic anticoagulation, hypertensive heart disease with chronic diastolic CHF, CKD stage III, asthma    Assessment / Plan / Recommendation  Clinical Impression  Pt says she does occasionally get strangled with liquids, but her primary complaints when it comes to swallowing is more related to solids, with pt also saying that she gets full quickly. When this happens, she describes a globus sensation and says that she feels like she can't put anything else in. She says that this is when she is more prone to getting strangled. Pt's oral motor exam was nonrevealing and she had no overt difficulty with liquids. There was a delayed cough noted after POs were completed, but a baseline cough makes it more difficult to discern if this is related to POs. Overall pt's reported symptoms appear to be more in line with a possible esophageal dysphagia with h/o GERD. She prefers a change to a  mechanical soft diet. Will keep on thin liquids. Pt was educated about esophageal and aspiration precautions, but will benefit from reinforcement so SLP will continue to follow. SLP Visit Diagnosis: Dysphagia, unspecified (R13.10)    Aspiration Risk  Mild aspiration risk;Moderate aspiration risk    Diet Recommendation Dysphagia 3 (Mech soft);Thin liquid   Liquid Administration via: Cup;Straw Medication Administration: Crushed with puree Supervision: Patient able to self feed;Intermittent supervision to cue for compensatory strategies Compensations: Minimize environmental distractions;Slow rate;Small sips/bites;Follow solids with liquid Postural Changes: Seated upright at 90 degrees;Remain upright for at least 30 minutes after po intake    Other  Recommendations Oral Care Recommendations: Oral care BID    Recommendations for follow up therapy are one component of a multi-disciplinary discharge planning process, led by the attending physician.  Recommendations may be updated based on patient status, additional functional criteria and insurance authorization.  Follow up Recommendations No SLP follow up      Assistance Recommended at Discharge    Functional Status Assessment Patient has had a recent decline in their functional status and demonstrates the ability to make significant improvements in function in a reasonable and predictable amount of time.  Frequency and Duration min 2x/week  1 week       Prognosis Prognosis for improved oropharyngeal function: Fair Barriers to Reach Goals: Cognitive deficits;Time post onset      Swallow Study  General HPI: Pt is a 89 yo female presenting 4/12 with acute on chronic CHF and concern for PNA. CXR 4/15 with increasing R basilar airspace opacities. PMH includes: GERD, persistent A-fib, on chronic anticoagulation, hypertensive heart disease with chronic diastolic CHF, CKD stage III, asthma Type of Study: Bedside Swallow Evaluation Previous  Swallow Assessment: none in chart Diet Prior to this Study: Regular;Thin liquids (Level 0) Temperature Spikes Noted: No Respiratory Status: Room air History of Recent Intubation: No Behavior/Cognition: Alert;Cooperative;Pleasant mood Oral Cavity Assessment: Within Functional Limits Oral Care Completed by SLP: No Oral Cavity - Dentition: Adequate natural dentition Vision: Functional for self-feeding Self-Feeding Abilities: Needs assist Patient Positioning: Upright in bed Baseline Vocal Quality: Normal Volitional Cough: Strong Volitional Swallow: Able to elicit    Oral/Motor/Sensory Function Overall Oral Motor/Sensory Function: Within functional limits   Ice Chips Ice chips: Not tested   Thin Liquid Thin Liquid: Within functional limits Presentation: Cup;Self Fed;Straw    Nectar Thick Nectar Thick Liquid: Not tested   Honey Thick Honey Thick Liquid: Not tested   Puree Puree: Within functional limits Presentation: Self Fed;Spoon   Solid     Solid: Impaired Presentation: Self Fed Pharyngeal Phase Impairments: Cough - Delayed      Mahala Menghini., M.A. CCC-SLP Acute Rehabilitation Services Office [phone]  Secure chat preferred  12/21/2022,4:58 PM

## 2022-12-22 DIAGNOSIS — I5033 Acute on chronic diastolic (congestive) heart failure: Secondary | ICD-10-CM | POA: Diagnosis not present

## 2022-12-22 LAB — BASIC METABOLIC PANEL
Anion gap: 6 (ref 5–15)
BUN: 50 mg/dL — ABNORMAL HIGH (ref 8–23)
CO2: 33 mmol/L — ABNORMAL HIGH (ref 22–32)
Calcium: 8.8 mg/dL — ABNORMAL LOW (ref 8.9–10.3)
Chloride: 86 mmol/L — ABNORMAL LOW (ref 98–111)
Creatinine, Ser: 1.03 mg/dL — ABNORMAL HIGH (ref 0.44–1.00)
GFR, Estimated: 50 mL/min — ABNORMAL LOW (ref >60)
Glucose, Bld: 120 mg/dL — ABNORMAL HIGH (ref 70–99)
Potassium: 4.3 mmol/L (ref 3.5–5.1)
Sodium: 125 mmol/L — ABNORMAL LOW (ref 135–145)

## 2022-12-22 LAB — SODIUM
Sodium: 124 mmol/L — ABNORMAL LOW (ref 135–145)
Sodium: 127 mmol/L — ABNORMAL LOW (ref 135–145)
Sodium: 125 mmol/L — ABNORMAL LOW (ref 135–145)
Sodium: 126 mmol/L — ABNORMAL LOW (ref 135–145)

## 2022-12-22 LAB — CBC
HCT: 34.2 % — ABNORMAL LOW (ref 36.0–46.0)
Hemoglobin: 11.9 g/dL — ABNORMAL LOW (ref 12.0–15.0)
MCH: 30.7 pg (ref 26.0–34.0)
MCHC: 34.8 g/dL (ref 30.0–36.0)
MCV: 88.4 fL (ref 80.0–100.0)
Platelets: 185 10*3/uL (ref 150–400)
RBC: 3.87 MIL/uL (ref 3.87–5.11)
RDW: 12.9 % (ref 11.5–15.5)
WBC: 8 10*3/uL (ref 4.0–10.5)
nRBC: 0 % (ref 0.0–0.2)

## 2022-12-22 MED ORDER — AMLODIPINE BESYLATE 10 MG PO TABS
10.0000 mg | ORAL_TABLET | Freq: Every day | ORAL | Status: DC
  Administered 2022-12-23 – 2022-12-24 (×2): 10 mg via ORAL
  Filled 2022-12-22 (×2): qty 1

## 2022-12-22 NOTE — Progress Notes (Signed)
PROGRESS NOTE Karina Chase  ZOX:[id number] DOB: 1925/01/20 DOA: 12/18/2022 PCP: Baldo Daub, MD  Brief Narrative/Hospital Course: 89 year old female with past medical history of persistent A-fib, on chronic anticoagulation, hypertensive heart disease with chronic diastolic CHF, CKD stage III, asthma present here with complaining of shortness of breath.  Her cardiologist recently increased Lasix to twice daily back in March.  She presented with cough, shortness of breath since 1 week.  She was given doxycycline and prednisone by her PCP however symptoms progressively worse therefore she came to the ER for further eval and management. Her imaging showed: Ground-glass opacities in the posterior right lobe which could be due to edema or infection/inflammation.  Bronchial wall thickening and mucous plugging in lower lobes.  Cardiomegaly and dilated IVC.  Enlargement pulmonary artery seen and pulmonary hypertension.  Patient was given a dose of Lasix and admitted for further eval and management of CHF exacerbation. 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 Seen by nephrology 4/15 placed on URE-NA 15 g twice daily and serial monitoring of sodium and improving    Subjective: Seen examined this morning. Daughter at the bedside-patient reports some cough overall feeling much better  Alert awake. 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 Cont serial sodium monitoring.If  worsening or drop may need 3%  saline. Recent Labs  Lab 12/21/22 1641 12/21/22 1953 12/22/22 0100 12/22/22 0436 12/22/22 0737  NA 117* 120* 124* 125* 127*    Acute on chronic diastolic WJX:BJYNWGNFA with worsening cough and shortness of breath.  BNP elevated at 599.CT chest done in ED. Cont on IV Lasix 40 mg daily. Cont to monitor strict I/O, daily weight . TTE with preserved EF unable to estimate diastolic function similar to prior.CXR 4/15- showed rt basilar opacities increased cont IS .she is net negative with improving weight as below: Net IO Since Admission: -2,510 mL [12/22/22 0947]  Filed Weights   12/20/22 0500 12/21/22 0500 12/22/22 0431  Weight: 70.2 kg 69.1 kg 67.1 kg   Acute asthma exacerbation Parainfluenza +: Tested negative for COVID flu and RSV.  Wheezing improving cough improving continue on prednisone 20 mg, Dulera, bronchodilators..Doxy was discontinued since procalcitonin was negative.    Persistent A-fib: Rate controlled only on Eliquis.   Elevated troponin: Patient denies ACS symptoms.  Likely demand ischemia.  Troponin was slightly elevated but flat   Hypertensive heart disease: BP stable continue antihypertensive amlodipine.   AKI on CKD stage IIIa: Baseline creatinine 1.1  peaked to 1.3> now at baseline.  Monitor renal function Recent Labs    12/18/22 1843 12/18/22 1934 12/19/22 1107 12/20/22 0444 12/20/22 1229 12/21/22 0054 12/22/22 0436  BUN 30* 27* 32* 33* 33* 35* 50*  CREATININE 1.30* 1.32* 1.21* 1.05* 1.06* 1.09* 1.03*    Normocytic anemia: hb is stable , cont to monitor Recent Labs  Lab 12/18/22 1934 12/19/22 1107 12/20/22 0444 12/21/22 0054 12/22/22 0436  HGB 10.0* 10.2* 9.5* 10.4* 11.9*  HCT 30.2* 30.5* 27.3* 29.5* 34.2*     Thrombocytopenia: Resolved.   Recent Labs  Lab 12/18/22 1934 12/19/22 1107  12/20/22 0444 12/21/22 0054 12/22/22 0436  PLT 141* 141* 124* 164 185    Lactic acid is 2.4 on 4/13 resolved  Goals of care CODE STATUS DNR, patient has been  living independently, given advanced age, monitor closely at risk of decompensation and readmission  DVT prophylaxis: apixaban (ELIQUIS) tablet 2.5 mg Start: 12/18/22 2245 Code Status:   Code Status: DNR Family Communication: plan of care discussed with patient/daughter at bedside. Patient status is:  inpatient  because of hyponatremia Level of care: Telemetry Medical   Dispo: The patient is from: home lives alone and independent            Anticipated disposition: PT OT recommending home health, daughter reports patient has nobody at home: Wanting to talk to [facility] regarding ALF versus SNF   Objective: Vitals last 24 hrs: Vitals:   12/21/22 1650 12/21/22 2014 12/22/22 0431 12/22/22 0826  BP: (!) 140/61 (!) 163/68 (!) 166/74 (!) 181/76  Pulse:  62 69 78  Resp: 16  16 18   Temp: (!) 97.4 F (36.3 C) (!) 97.5 F (36.4 C) 97.7 F (36.5 C) (!) 97.3 F (36.3 C)  TempSrc:  Oral Oral Oral  SpO2: 94% 95% 92% 93%  Weight:   67.1 kg    Weight change: -2 kg  Physical Examination: General exam: alert awake, oriented, pleasant, older than stated age HEENT:Oral mucosa moist, Ear/Nose WNL grossly Respiratory system: Bilaterally mild wheezing but improved, no use of accessory muscle Cardiovascular system: S1 & S2 +, No JVD. Gastrointestinal system: Abdomen soft,NT,ND, BS+ Nervous System: Alert, awake, moving extremities well and follows commands. Extremities: LE edema neg,distal peripheral pulses palpable.  Skin: No rashes,no icterus. MSK: Normal muscle bulk,tone, power  Medications reviewed:  Scheduled Meds:  amLODipine  5 mg Oral Daily   apixaban  2.5 mg Oral BID   docusate sodium  100 mg Oral BID   furosemide  40 mg Intravenous Daily   mometasone-formoterol  2 puff Inhalation BID   predniSONE  20 mg Oral Q breakfast   sodium chloride flush  3 mL Intravenous Q12H   urea  15 g Oral BID   Continuous Infusions:  sodium chloride      Diet Order             DIET DYS 3 Room service  appropriate? Yes with Assist; Fluid consistency: Thin; Fluid restriction: 1200 mL Fluid  Diet effective now                   Intake/Output Summary (Last 24 hours) at 12/22/2022 0947 Last data filed at 12/22/2022 0438 Gross per 24 hour  Intake --  Output 2200 ml  Net -2200 ml   Net IO Since Admission: -2,510 mL [12/22/22 0947]  Wt Readings from Last 3 Encounters:  12/22/22 67.1 kg  10/14/22 63.5 kg  04/10/22 60.8 kg     Unresulted Labs (From admission, onward)     Start     Ordered   12/22/22 0500  CBC  Daily,   R      12/21/22 0947   12/21/22 1600  Sodium  Now then every 4 hours,   R      12/21/22 1208   12/20/22 0500  Basic metabolic panel  Daily,   R     Comments: As Scheduled for 5 days    12/19/22 0146          Data Reviewed: I have personally reviewed following labs and imaging studies CBC: Recent Labs  Lab 12/18/22 1934 12/19/22 1107 12/20/22 0444 12/21/22 0054 12/22/22 0436  WBC 4.5 3.9* 3.5* 7.6 8.0  NEUTROABS 3.4  --   --   --   --   HGB 10.0* 10.2* 9.5* 10.4* 11.9*  HCT 30.2* 30.5* 27.3* 29.5* 34.2*  MCV 93.5 92.1 90.1 88.6 88.4  PLT 141* 141* 124* 164 185   Basic Metabolic Panel: Recent Labs  Lab 12/19/22 1107 12/19/22 1427 12/20/22 0444 12/20/22 1229 12/21/22 0054 12/21/22 1203 12/21/22 1641 12/21/22 1953 12/22/22 0100 12/22/22 0436 12/22/22 0737  NA 122*   < > 118* 121* 118*   < > 117* 120* 124* 125* 127*  K 3.9  --  4.2 4.5 4.6  --   --   --   --  4.3  --   CL 82*  --  84* 83* 81*  --   --   --   --  86*  --   CO2 27  --  --   --   --   --  33*  --   GLUCOSE 177*  --  163* 142* 133*  --   --   --   --  120*  --   BUN 32*  --  33* 33* 35*  --   --   --   --  50*  --   CREATININE 1.21*  --  1.05* 1.06* 1.09*  --   --   --   --  1.03*  --   CALCIUM 8.6*  --  8.1* 8.7* 8.6*  --   --   --   --  8.8*  --   MG 1.7  --  2.4  --   --   --   --   --   --   --   --    < > = values in this interval not displayed.    GFR: Estimated Creatinine Clearance: 30.1 mL/min (A) (by C-G formula based on SCr of 1.03 mg/dL (H)). Liver Function Tests: Recent Labs  Lab 12/18/22 1934  AST 46*  ALT 35  ALKPHOS 78  BILITOT 0.7  PROT 7.0  ALBUMIN 3.3*  Sepsis Labs: Recent Labs  Lab 12/18/22 1958 12/19/22 1107 12/19/22 1427 12/20/22 0955  PROCALCITON  --  <0.10  --   --   LATICACIDVEN 1.0 1.7 2.4* 1.5    Recent Results (from the past 240 hour(s))  Resp panel by RT-PCR (RSV, Flu A&B, Covid) Anterior Nasal Swab     Status: None   Collection Time: 12/18/22  8:06 PM   Specimen: Anterior Nasal Swab  Result Value Ref Range Status   SARS Coronavirus 2 by RT PCR NEGATIVE NEGATIVE Final   Influenza A by PCR NEGATIVE NEGATIVE Final   Influenza B by PCR NEGATIVE NEGATIVE Final    Comment: (NOTE) The Xpert Xpress SARS-CoV-2/FLU/RSV plus assay is intended as an aid in the diagnosis of influenza from Nasopharyngeal swab specimens and should not be used as a sole basis for treatment. Nasal washings and aspirates are unacceptable for Xpert Xpress SARS-CoV-2/FLU/RSV testing.  Fact Sheet for Patients: BloggerCourse.com  Fact Sheet for Healthcare Providers: SeriousBroker.it  This test is not yet approved or cleared by the [place] FDA and has been authorized for detection and/or diagnosis of SARS-CoV-2 by FDA under an Emergency Use Authorization (EUA). This EUA will remain in effect (meaning this test can be used) for the duration of the COVID-19 declaration under Section 564(b)(1) of  the Act, 21 U.S.C. section 360bbb-3(b)(1), unless the authorization is terminated or revoked.     Resp Syncytial Virus by PCR NEGATIVE NEGATIVE Final    Comment: (NOTE) Fact Sheet for Patients: BloggerCourse.com  Fact Sheet for Healthcare Providers: SeriousBroker.it  This test is not yet approved or cleared by the [place] FDA and has been authorized for detection and/or diagnosis of SARS-CoV-2 by FDA under an Emergency Use Authorization (EUA). This EUA will remain in effect (meaning this test can be used) for the duration of the COVID-19 declaration under Section 564(b)(1) of the Act, 21 U.S.C. section 360bbb-3(b)(1), unless the authorization is terminated or revoked.  Performed at [facility], [address], [place], [place] [address]   Respiratory (~20 pathogens) panel by PCR     Status: Abnormal   Collection Time: 12/20/22  3:11 PM   Specimen: Nasopharyngeal Swab; Respiratory  Result Value Ref Range Status   Adenovirus NOT DETECTED NOT DETECTED Final   Coronavirus 229E NOT DETECTED NOT DETECTED Final    Comment: (NOTE) The Coronavirus on the Respiratory Panel, DOES NOT test for the novel  Coronavirus (2019 nCoV)    Coronavirus HKU1 NOT DETECTED NOT DETECTED Final   Coronavirus NL63 NOT DETECTED NOT DETECTED Final   Coronavirus OC43 NOT DETECTED NOT DETECTED Final   Metapneumovirus NOT DETECTED NOT DETECTED Final   Rhinovirus / Enterovirus NOT DETECTED NOT DETECTED Final   Influenza A NOT DETECTED NOT DETECTED Final   Influenza B NOT DETECTED NOT DETECTED Final   Parainfluenza Virus 1 NOT DETECTED NOT DETECTED Final   Parainfluenza Virus 2 NOT DETECTED NOT DETECTED Final   Parainfluenza Virus 3 DETECTED (A) NOT DETECTED Final   Parainfluenza Virus 4 NOT DETECTED NOT DETECTED Final   Respiratory Syncytial Virus NOT DETECTED NOT DETECTED Final   Bordetella pertussis NOT DETECTED NOT DETECTED Final   Bordetella Parapertussis NOT DETECTED NOT DETECTED Final   Chlamydophila pneumoniae NOT DETECTED NOT DETECTED Final   Mycoplasma pneumoniae NOT DETECTED NOT DETECTED Final    Comment: Performed at [facility], [address] [place], [place] [address]    Antimicrobials: Anti-infectives (From admission, onward)    Start     Dose/Rate Route Frequency Ordered Stop   12/19/22  1145  doxycycline (VIBRA-TABS) tablet 100 mg  Status:  Discontinued        100 mg Oral Every 12 hours 12/19/22 1051 12/20/22 0850      Culture/Microbiology No results found for: "SDES", "SPECREQUEST", "CULT", "REPTSTATUS"  Other culture-see note  Radiology Studies: DG Chest Port 1 View  Result Date: 12/21/2022 CLINICAL DATA:  [id number] Congestive heart failure (CHF) [id number] EXAM: PORTABLE CHEST 1 VIEW COMPARISON:  Radiograph 12/18/2022 FINDINGS: Unchanged cardiomegaly. Bibasilar atelectasis. Trace right pleural effusion. Left peripheral basilar pleural thickening, unchanged. Increased right basilar airspace disease. No pneumothorax. Bones are unchanged. IMPRESSION: Increased right basilar airspace opacities, could be atelectasis or developing infection. Electronically Signed   By: Caprice Renshaw M.D.   On: 12/21/2022 09:55     LOS: 3 days   Lanae Boast, MD [facility]  12/22/2022, 9:47 AM

## 2022-12-22 NOTE — Care Management Important Message (Signed)
Important Message  Patient Details  Name: Karina Chase MRN: [id number] Date of Birth: 09-11-1924   Medicare Important Message Given:  Yes     Sherilyn Banker 12/22/2022, 1:35 PM

## 2022-12-22 NOTE — Progress Notes (Signed)
[facility] Progress Note    Assessment/ Plan:   Hyponatremia, severe. 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 Possibly related to respiratory issues? Euvolemic on exam. TSH WNL. Repeat osm/urine studies on 4/15 shows low serum osm, low urine osm. Likely related to low solute intake as well. Will hold on tomorrow's lasix dose -latest Na improved to 127 this AM -Started on ure-na 15g BID-avoiding overcorrection therefore PM urea d/c'ed for now. Can restart if needed -fluid restrict 1.2L/day -monitor Na q6h   AKI on CKD 3 -baseline Cr around 1.1 -Cr back to baseline -daily labs, strict I/O   Acute on chronic diastolic CHF exacerbation -repeat CXR 4/15 better. Seems euvolemic on exam, lasix held as above   Acute asthma exacerbation -receiving prednisone -per primary service   Afib -on eliquis, rate controlled, not receiving any rate controlling meds   HTN -BP slightly higher than expected, possibly related to steroids? BP remains uncontrolled, will increase her amlodipine to  daily   Anemia -mild -per primary service  Subjective:   No acute events. Daughter and niece at bedside. Patient reports that she is coughing a lot. Fogginess is better   Objective:   BP (!) 181/76 (BP Location: Left Arm)   Pulse 78   Temp (!) 97.3 F (36.3 C) (Oral)   Resp 18   Wt 67.1 kg   SpO2 93%   BMI 26.20 kg/m   Intake/Output Summary (Last 24 hours) at 12/22/2022 1216 Last data filed at 12/22/2022 1057 Gross per 24 hour  Intake 120 ml  Output 2200 ml  Net -2080 ml   Weight change: -2 kg  Physical Exam: Gen: NAD CVS: irreg irreg Resp: decreased breath sounds right > left base Abd: soft Ext: no edema Neuro: awake, alert, hard of hearing  Imaging: DG Chest Port 1 View  Result Date: 12/21/2022 CLINICAL DATA:  [id number] Congestive heart failure (CHF) [id number] EXAM: PORTABLE CHEST 1 VIEW COMPARISON:  Radiograph 12/18/2022 FINDINGS: Unchanged cardiomegaly.  Bibasilar atelectasis. Trace right pleural effusion. Left peripheral basilar pleural thickening, unchanged. Increased right basilar airspace disease. No pneumothorax. Bones are unchanged. IMPRESSION: Increased right basilar airspace opacities, could be atelectasis or developing infection. Electronically Signed   By: Caprice Renshaw M.D.   On: 12/21/2022 09:55    Labs: BMET Recent Labs  Lab 12/18/22 1843 12/18/22 1934 12/19/22 1107 12/19/22 1427 12/20/22 0444 12/20/22 1229 12/21/22 0054 12/21/22 1203 12/21/22 1641 12/21/22 1953 12/22/22 0100 12/22/22 0436 12/22/22 0737  NA 122* 120* 122*   < > 118* 121* 118* 118* 117* 120* 124* 125* 127*  K 4.4 4.3 3.9  --  4.2 4.5 4.6  --   --   --   --  4.3  --   CL 86* 84* 82*  --  84* 83* 81*  --   --   --   --  86*  --   CO2  --  25 27  --  --   --   --   --  33*  --   GLUCOSE 118* 114* 177*  --  163* 142* 133*  --   --   --   --  120*  --   BUN 30* 27* 32*  --  33* 33* 35*  --   --   --   --  50*  --   CREATININE 1.30* 1.32* 1.21*  --  1.05* 1.06* 1.09*  --   --   --   --  1.03*  --   CALCIUM  --  8.3* 8.6*  --  8.1* 8.7* 8.6*  --   --   --   --  8.8*  --    < > = values in this interval not displayed.   CBC Recent Labs  Lab 12/18/22 1934 12/19/22 1107 12/20/22 0444 12/21/22 0054 12/22/22 0436  WBC 4.5 3.9* 3.5* 7.6 8.0  NEUTROABS 3.4  --   --   --   --   HGB 10.0* 10.2* 9.5* 10.4* 11.9*  HCT 30.2* 30.5* 27.3* 29.5* 34.2*  MCV 93.5 92.1 90.1 88.6 88.4  PLT 141* 141* 124* 164 185    Medications:     amLODipine  5 mg Oral Daily   apixaban  2.5 mg Oral BID   docusate sodium  100 mg Oral BID   furosemide  40 mg Intravenous Daily   mometasone-formoterol  2 puff Inhalation BID   predniSONE  20 mg Oral Q breakfast   sodium chloride flush  3 mL Intravenous Q12H   urea  15 g Oral BID      Anthony Sar, MD [facility] 12/22/2022, 12:16 PM

## 2022-12-22 NOTE — Progress Notes (Signed)
Na stable at 126, continue to hold ure-Na for now and follow.

## 2022-12-22 NOTE — Progress Notes (Signed)
Speech Language Pathology Treatment: Dysphagia  Patient Details Name: Karina Chase MRN: [id number] DOB: 05-21-1925 Today's Date: 12/22/2022 Time: 7829-5621 SLP Time Calculation (min) (ACUTE ONLY): 9 min  Assessment / Plan / Recommendation Clinical Impression  Pt coughing intermittently prior to any PO trials. She reports ongoing globus sensation and feeling as if she gets full quickly, which she attributes to constipation. Pt was observed during trials of thin liquids, purees, and solids with no overt s/s of dysphagia. She had one delayed cough after completion of all trials, although question if this was directly related to swallowing since a cough was noted at baseline. SLP reinforced aspiration and esophageal precautions. Since pt's symptoms appear more consistent with esophageal dysphagia, no further SLP f/u is necessary at this time. Pt reports preferring continuing diet of Dys 3 type textures and thin liquids as it more closely resembles her baseline diet.    HPI HPI: Pt is a 89 yo female presenting 4/12 with acute on chronic CHF and concern for PNA. CXR 4/15 with increasing R basilar airspace opacities. PMH includes: GERD, persistent A-fib, on chronic anticoagulation, hypertensive heart disease with chronic diastolic CHF, CKD stage III, asthma      SLP Plan  All goals met      Recommendations for follow up therapy are one component of a multi-disciplinary discharge planning process, led by the attending physician.  Recommendations may be updated based on patient status, additional functional criteria and insurance authorization.    Recommendations  Diet recommendations: Dysphagia 3 (mechanical soft);Thin liquid Liquids provided via: Cup;Straw Medication Administration: Whole meds with liquid (can be given with puree PRN) Supervision: Patient able to self feed Compensations: Minimize environmental distractions;Slow rate;Small sips/bites;Follow solids with liquid Postural Changes  and/or Swallow Maneuvers: Seated upright 90 degrees;Upright 30-60 min after meal                  Oral care BID   Intermittent Supervision/Assistance Dysphagia, unspecified (R13.10)     All goals met     Brunilda Payor., Student SLP  12/22/2022, 3:28 PM

## 2022-12-22 NOTE — TOC Progression Note (Addendum)
Transition of Care [facility]) - Progression Note    Patient Details  Name: KESHANTI CHASTEEN MRN: [id number] Date of Birth: 1924/10/06  [facility] CM/SW Contact  Carley Hammed, [place] Phone Number: 12/22/2022, 1:08 PM  Clinical Narrative:     CSW spoke with pt's daughter who requested CSW speak with Revonda Standard, pt's grndtr. Dtr also requested Revonda Standard be added to contact sheets. Per Revonda Standard, family is working on getting pt into ALF. Pt notes concerns about going home in her weakened state. CSW met with pt, dtr, grndtr by phone. Medicaid application provided. Grndtr working on which facilities she would like referral's sent to. FL2 requested and completed, will need cosigned. Family planning on taking pt home and getting her placed shortly after, if it cannot be done from the hospital. CSW to send out referral's to appropriate facilities. TOC will continue to follow.       2:30 Additional referrals sent out to possible ALF placements per family request. TOC is available for additional needs.   Expected Discharge Plan and Services                                               Social Determinants of Health (SDOH) Interventions SDOH Screenings   Tobacco Use: Low Risk  (12/18/2022)    Readmission Risk Interventions     No data to display

## 2022-12-22 NOTE — [place] FL2 (Signed)
[place] MEDICAID [id number] LEVEL OF CARE FORM     IDENTIFICATION  Patient Name: Karina Chase Chase Birthdate: Nov 10, 1924 Sex: female Admission Date (Current Location): 12/18/2022  [facility] and [place] Number:  Producer, television/film/video and Address:  [facility], [address], [place], [place] [address]      Provider Number: [id number]  Attending Physician Name and Address:  Lanae Boast, MD  Relative Name and Phone Number:  Audria Nine, [phone]    Current Level of Care: Hospital Recommended Level of Care: Assisted Living Facility Prior Approval Number:    Date Approved/Denied:   PASRR Number: N/A  Discharge Plan: Other (Comment) (ALF)    Current Diagnoses: Patient Active Problem List   Diagnosis Date Noted   Acute on chronic diastolic CHF (congestive heart failure) 12/19/2022   Hyponatremia 12/18/2022   Chronic kidney disease 12/13/2020   Generalized edema 12/13/2020   Hearing loss 12/13/2020   Overactive bladder 12/13/2020   Type 2 diabetes mellitus without complications 12/13/2020   Arthritis    Asthma    Atrial fibrillation    Breast cancer    Cancer    Colon polyp    GERD (gastroesophageal reflux disease)    History of shingles    Hypertension    Stress incontinence    Vertigo    Hypertensive heart disease 05/30/2020   Right groin pain 03/28/2013    Orientation RESPIRATION BLADDER Height & Weight     Self, Time, Situation, Place  Normal Continent Weight: 147 lb 14.9 oz (67.1 kg) Height:     BEHAVIORAL SYMPTOMS/MOOD NEUROLOGICAL BOWEL NUTRITION STATUS      Continent Diet (Dysphagia 3, thin liquids)  AMBULATORY STATUS COMMUNICATION OF NEEDS Skin   Supervision Verbally Normal                       Personal Care Assistance Level of Assistance  Bathing, Feeding, Dressing Bathing Assistance: Limited assistance Feeding assistance: Independent Dressing Assistance: Limited assistance     Functional Limitations Info  Sight,  Hearing, Speech Sight Info: Impaired (Reading Glasses) Hearing Info: Impaired Speech Info: Adequate    SPECIAL CARE FACTORS FREQUENCY  PT (By licensed PT), OT (By licensed OT)     PT Frequency: HH Recommended 3x week OT Frequency: HH Recommended 3x week            Contractures Contractures Info: Not present    Additional Factors Info  Code Status, Allergies Code Status Info: DNR Allergies Info: Levofloxacin  Keflex (Cephalexin)  Prilosec (Omeprazole)  Sulfa Antibiotics  Sulfamethoxazole-trimethoprim  Azithromycin  Penicillin G  Penicillins           Current Medications (12/22/2022):  This is the current hospital active medication list Current Facility-Administered Medications  Medication Dose Route Frequency Provider Last Rate Last Admin   0.9 %  sodium chloride infusion  250 mL Intravenous PRN Hillary Bow, DO       acetaminophen (TYLENOL) tablet 650 mg  650 mg Oral Q4H PRN Hillary Bow, DO       albuterol (PROVENTIL) (2.5 MG/3ML) 0.083% nebulizer solution 2.5 mg  2.5 mg Nebulization Q2H PRN Joseph Art, DO       [START ON 12/23/2022] amLODipine (NORVASC) tablet 10 mg  10 mg Oral Daily Anthony Sar, MD       apixaban Everlene Balls) tablet 2.5 mg  2.5 mg Oral BID Lyda Perone M, DO   2.5 mg at 12/22/22 3086   benzonatate (  TESSALON) capsule 200 mg  200 mg Oral TID PRN Hillary Bow, DO   200 mg at 12/21/22 2113   bisacodyl (DULCOLAX) suppository 10 mg  10 mg Rectal Daily PRN Joseph Art, DO   10 mg at 12/21/22 1009   docusate sodium (COLACE) capsule 100 mg  100 mg Oral BID Vann, Leeba Barbe U, DO   100 mg at 12/22/22 0951   guaiFENesin (ROBITUSSIN) 100 MG/5ML liquid 5 mL  5 mL Oral Q4H PRN Joseph Art, DO       mometasone-formoterol (DULERA) 200-5 MCG/ACT inhaler 2 puff  2 puff Inhalation BID Hillary Bow, DO   2 puff at 12/22/22 0919   nitroGLYCERIN (NITROSTAT) SL tablet 0.4 mg  0.4 mg Sublingual Q5 min PRN Pahwani, Rinka R, MD       ondansetron (ZOFRAN)  injection 4 mg  4 mg Intravenous Q6H PRN Julian Reil, Jared M, DO       predniSONE (DELTASONE) tablet 20 mg  20 mg Oral Q breakfast Kc, Ramesh, MD   20 mg at 12/22/22 0951   sodium chloride flush (NS) 0.9 % injection 3 mL  3 mL Intravenous Q12H Lyda Perone M, DO   3 mL at 12/22/22 0959   sodium chloride flush (NS) 0.9 % injection 3 mL  3 mL Intravenous PRN Hillary Bow, DO         Discharge Medications: FL2 will be updated prior to discharge to reflect accurate discharge medications. Patient will NOT discharge on Intravenous Medications.  Relevant Imaging Results:  Relevant Lab Results:   Additional Information SS# [id number]  Carley Hammed MSW, [address]

## 2022-12-22 NOTE — Progress Notes (Signed)
Physical Therapy Treatment Patient Details Name: Karina Chase MRN: [id number] DOB: 06-Apr-1925 Today's Date: 12/22/2022   History of Present Illness Pt is 89 yo female presenting to [facility] with reports of shortness of breath. PMH: A-fib, HTN, chronic diastolic CHF, CKD III, Asthma.    PT Comments    Tolerated treatment well, very pleasant and appreciative of therapy visit. Daughter provides additional hx today and has concerns about altered mentation. Per daughter, at baseline, pt functions at an independent level, no assistive device, drives, gardens in her back yard, and manages her own medications. Pt currently oriented x4 with therapist and follows multi-step commands but is HOH. Daughter states pt with intermittent confusion since admission. Family is working with SW on ALF placement as the family cannot provide supervision at home. This is an excellent option. Pt requires supervision in her present state with min guard to ambulate and min assist to navigate stairs. If ALF falls through, would consider post acute rehab placement until she can function at a mod I level again.  Patient will continue to benefit from skilled physical therapy services to further improve independence with functional mobility.   Recommendations for follow up therapy are one component of a multi-disciplinary discharge planning process, led by the attending physician.  Recommendations may be updated based on patient status, additional functional criteria and insurance authorization.  Follow Up Recommendations  Can patient physically be transported by private vehicle: Yes    Assistance Recommended at Discharge Intermittent Supervision/Assistance  Patient can return home with the following A little help with walking and/or transfers;Assist for transportation;Assistance with cooking/housework;Help with stairs or ramp for entrance   Equipment Recommendations  None recommended by PT (States she has a RW and BSC at  home.)    Recommendations for Other Services OT consult     Precautions / Restrictions Precautions Precautions: Fall Restrictions Weight Bearing Restrictions: No     Mobility  Bed Mobility Overal bed mobility: Modified Independent             General bed mobility comments: slight increase in time no physical assistance required.    Transfers Overall transfer level: Needs assistance Equipment used: Rolling walker (2 wheels) Transfers: Sit to/from Stand Sit to Stand: Min guard           General transfer comment: Min guard for safety and RW block. Cues for technique, requires several times to rock forward and rise from EOB using back of knees to brace.    Ambulation/Gait Ambulation/Gait assistance: Min guard Gait Distance (Feet): 140 Feet Assistive device: Rolling walker (2 wheels), None Gait Pattern/deviations: Step-through pattern, Decreased stride length Gait velocity: decreased Gait velocity interpretation: <1.8 ft/sec, indicate of risk for recurrent falls   General Gait Details: Min guard for safety, ambulating with RW shows fair balance without overt instability, slower gait, shortened step length bil. Without RW pt with increased sway, slower gait speed, more guarded. No buckling noted. Cues for safety and awareness.   Stairs Stairs: Yes Stairs assistance: Min assist Stair Management: One rail Left, Alternating pattern, Forwards Number of Stairs: 6 General stair comments: navigated steps similar to home set-up, required min assist for one episode of LOB when transitioning from rail to RW. Cues for sequencing, safety, and awareness.   Wheelchair Mobility    Modified Rankin (Stroke Patients Only)       Balance Overall balance assessment: Mild deficits observed, not formally tested  Cognition Arousal/Alertness: Awake/alert Behavior During Therapy: WFL for tasks assessed/performed Overall  Cognitive Status: Difficult to assess                                 General Comments: daughter states pt intermittently confused. She is oriented x4 with therapist.        Exercises      General Comments General comments (skin integrity, edema, etc.): Daughter present, providing additional information. pt baseline independent without AD, no hx of recent falls, cognitively sharp, able to manage meds at home. Currently having intermittent confusion per daughter, concerned she would not be able to care for herself at d/c. Family unable to provide supervision.      Pertinent Vitals/Pain Pain Assessment Pain Assessment: No/denies pain    Home Living                          Prior Function            PT Goals (current goals can now be found in the care plan section) Acute Rehab PT Goals Patient Stated Goal: Pt would like to go to rehab PT Goal Formulation: With patient/family Time For Goal Achievement: 01/04/23 Potential to Achieve Goals: Fair Progress towards PT goals: Progressing toward goals    Frequency    Min 3X/week      PT Plan Discharge plan needs to be updated;Equipment recommendations need to be updated    Co-evaluation              AM-PAC PT "6 Clicks" Mobility   Outcome Measure  Help needed turning from your back to your side while in a flat bed without using bedrails?: None Help needed moving from lying on your back to sitting on the side of a flat bed without using bedrails?: None Help needed moving to and from a bed to a chair (including a wheelchair)?: A Little Help needed standing up from a chair using your arms (e.g., wheelchair or bedside chair)?: A Little Help needed to walk in hospital room?: A Little Help needed climbing 3-5 steps with a railing? : A Little 6 Click Score: 20    End of Session Equipment Utilized During Treatment: Gait belt Activity Tolerance: Patient tolerated treatment well Patient left: in  bed;with call bell/phone within reach;with family/visitor present;with bed alarm set   PT Visit Diagnosis: Unsteadiness on feet (R26.81);Other abnormalities of gait and mobility (R26.89)     Time: 1610-9604 PT Time Calculation (min) (ACUTE ONLY): 27 min  Charges:  $Gait Training: 8-22 mins $Therapeutic Activity: 8-22 mins                     Kathlyn Sacramento, PT, DPT Physical Therapist Acute Rehabilitation Services [facility]    Berton Mount 12/22/2022, 3:29 PM

## 2022-12-23 ENCOUNTER — Encounter (HOSPITAL_COMMUNITY): Payer: Self-pay | Admitting: Internal Medicine

## 2022-12-23 DIAGNOSIS — J45909 Unspecified asthma, uncomplicated: Secondary | ICD-10-CM

## 2022-12-23 DIAGNOSIS — I4891 Unspecified atrial fibrillation: Secondary | ICD-10-CM

## 2022-12-23 DIAGNOSIS — I700 Atherosclerosis of aorta: Secondary | ICD-10-CM

## 2022-12-23 DIAGNOSIS — R001 Bradycardia, unspecified: Secondary | ICD-10-CM

## 2022-12-23 DIAGNOSIS — I5033 Acute on chronic diastolic (congestive) heart failure: Secondary | ICD-10-CM | POA: Diagnosis not present

## 2022-12-23 LAB — BASIC METABOLIC PANEL
Anion gap: 10 (ref 5–15)
BUN: 40 mg/dL — ABNORMAL HIGH (ref 8–23)
CO2: 33 mmol/L — ABNORMAL HIGH (ref 22–32)
Calcium: 8.7 mg/dL — ABNORMAL LOW (ref 8.9–10.3)
Chloride: 89 mmol/L — ABNORMAL LOW (ref 98–111)
Creatinine, Ser: 0.95 mg/dL (ref 0.44–1.00)
GFR, Estimated: 55 mL/min — ABNORMAL LOW (ref >60)
Glucose, Bld: 112 mg/dL — ABNORMAL HIGH (ref 70–99)
Potassium: 3.7 mmol/L (ref 3.5–5.1)
Sodium: 132 mmol/L — ABNORMAL LOW (ref 135–145)

## 2022-12-23 LAB — CBC
HCT: 32.1 % — ABNORMAL LOW (ref 36.0–46.0)
Hemoglobin: 11.2 g/dL — ABNORMAL LOW (ref 12.0–15.0)
MCH: 31.1 pg (ref 26.0–34.0)
MCHC: 34.9 g/dL (ref 30.0–36.0)
MCV: 89.2 fL (ref 80.0–100.0)
Platelets: 198 10*3/uL (ref 150–400)
RBC: 3.6 MIL/uL — ABNORMAL LOW (ref 3.87–5.11)
RDW: 12.9 % (ref 11.5–15.5)
WBC: 8.2 10*3/uL (ref 4.0–10.5)
nRBC: 0 % (ref 0.0–0.2)

## 2022-12-23 LAB — SODIUM
Sodium: 125 mmol/L — ABNORMAL LOW (ref 135–145)
Sodium: 129 mmol/L — ABNORMAL LOW (ref 135–145)

## 2022-12-23 LAB — MAGNESIUM: Magnesium: 2.4 mg/dL (ref 1.7–2.4)

## 2022-12-23 MED ORDER — POTASSIUM CHLORIDE 10 MEQ/100ML IV SOLN
10.0000 meq | INTRAVENOUS | Status: AC
  Administered 2022-12-23 (×2): 10 meq via INTRAVENOUS
  Filled 2022-12-23: qty 100

## 2022-12-23 MED ORDER — UREA 15 G PO PACK
15.0000 g | PACK | Freq: Two times a day (BID) | ORAL | Status: DC
  Administered 2022-12-23 – 2022-12-24 (×3): 15 g via ORAL
  Filled 2022-12-23 (×4): qty 1

## 2022-12-23 NOTE — Progress Notes (Signed)
[facility] Progress Note    Assessment/ Plan:   Hyponatremia, severe. 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 Possibly related to respiratory issues? Euvolemic on exam. TSH WNL. Repeat osm/urine studies on 4/15 shows low serum osm, low urine osm. Likely related to low solute intake as well. Will hold on lasix -fluid restrict 1.2L/day -monitor Na daily -slight drop in Na, r/s ure-na 15g BID today, let's see  how her sodium does tomorrow. If going to SNF, can resume this there but will need intermittent labs done   AKI on CKD 3 -baseline Cr around 1.1 -Cr back to baseline -daily labs, strict I/O   Acute on chronic diastolic CHF exacerbation -repeat CXR 4/15 better. Seems euvolemic on exam, lasix held as above, can resume on d/c   Acute asthma exacerbation -receiving prednisone -per primary service   Afib -on eliquis, rate controlled, not receiving any rate controlling meds   HTN -BP slightly higher than expected, possibly related to steroids? BP remains uncontrolled, will increase her amlodipine to  daily   Anemia -mild -per primary service  Discussed with daughter over the phone just now.  Anthony Sar, MD [facility]  Subjective:   No acute events. Patient reports that her breathing is so much better. She does complain of constipation and is awaiting an enema Na up to 132 this am, 129 on recheck   Objective:   BP (!) 162/67 (BP Location: Left Arm)   Pulse (!) 52   Temp 98.4 F (36.9 C) (Oral)   Resp 18   Wt 65.5 kg   SpO2 97%   BMI 25.58 kg/m   Intake/Output Summary (Last 24 hours) at 12/23/2022 1348 Last data filed at 12/23/2022 1100 Gross per 24 hour  Intake 240 ml  Output --  Net 240 ml   Weight change: -1.6 kg  Physical Exam: Gen: NAD CVS: irreg irreg Resp: normal WOB Abd: soft Ext: no edema Neuro: awake, alert, hard of hearing  Imaging: No results found.  Labs: BMET Recent Labs  Lab  12/18/22 1934 12/19/22 1107 12/19/22 1427 12/20/22 0444 12/20/22 1229 12/21/22 0054 12/21/22 1203 12/22/22 0436 12/22/22 0737 12/22/22 1154 12/22/22 1739 12/23/22 0027 12/23/22 0532 12/23/22 1149  NA 120* 122*   < > 118* 121* 118*   < > 125* 127* 125* 126* 125* 132* 129*  K 4.3 3.9  --  4.2 4.5 4.6  --  4.3  --   --   --   --  3.7  --   CL 84* 82*  --  84* 83* 81*  --  86*  --   --   --   --  89*  --   CO2 25 27  --  --  33*  --   --   --   --  33*  --   GLUCOSE 114* 177*  --  163* 142* 133*  --  120*  --   --   --   --  112*  --   BUN 27* 32*  --  33* 33* 35*  --  50*  --   --   --   --  40*  --   CREATININE 1.32* 1.21*  --  1.05* 1.06* 1.09*  --  1.03*  --   --   --   --  0.95  --   CALCIUM 8.3* 8.6*  --  8.1* 8.7* 8.6*  --  8.8*  --   --   --   --  8.7*  --    < > = values in this interval not displayed.   CBC Recent Labs  Lab 12/18/22 1934 12/19/22 1107 12/20/22 0444 12/21/22 0054 12/22/22 0436 12/23/22 0532  WBC 4.5   < > 3.5* 7.6 8.0 8.2  NEUTROABS 3.4  --   --   --   --   --   HGB 10.0*   < > 9.5* 10.4* 11.9* 11.2*  HCT 30.2*   < > 27.3* 29.5* 34.2* 32.1*  MCV 93.5   < > 90.1 88.6 88.4 89.2  PLT 141*   < > 124* 164 185 198   < > = values in this interval not displayed.    Medications:     amLODipine  10 mg Oral Daily   apixaban  2.5 mg Oral BID   docusate sodium  100 mg Oral BID   mometasone-formoterol  2 puff Inhalation BID   predniSONE  20 mg Oral Q breakfast   sodium chloride flush  3 mL Intravenous Q12H   urea  15 g Oral BID      Anthony Sar, MD [facility] 12/23/2022, 1:48 PM

## 2022-12-23 NOTE — Consult Note (Addendum)
Cardiology Consultation   Patient ID: Karina Chase MRN: [id number]; DOB: [birth year]  Admit date: 12/18/2022 Date of Consult: 12/23/2022  PCP:  Karina Daub, MD   [facility] Providers Cardiologist:  Karina Herrlich, MD        Patient Profile:   Karina Chase is a 89 y.o. female with a hx of persistent atrial fibrillation, moderate pulmonary HTN with mild MR/moderate TR by prior echo, arthritis, asthma, breast CA, CKD stage 3b, chronic diastolic CHF who is being seen 12/23/2022 for the evaluation of pauses at the request of Dr. Dayna Chase.   History of Present Illness:   Karina Chase follows with Dr. Dulce Chase with the above history. Her afib has been managed with rate control and anticoagulation. She has not required AVN blocking agents, baseline HR 60s. Dr. Dulce Chase has kept her on the lower dose of Eliquis. Prior echo in 2021 showed normal EF and moderate pulm HTN. She was doing well at last cardiology OV 10/2022 and no changes were made at that time.  She presented to the hospital with cough, weakness, and SOB for the previous week. She saw her PCP who was concerned for PNA and started her on doxycyline + prednisone the day prior to admission but symptoms worsened so she came to ER. RVP + parainfluenza. She was found to have severe hyponatremia down to a nadir 117 ultimately felt due to SIADH. BNP was 599, Cr 1.3 (prev baseline 1.2). CXR showed NAD. CT chest showed mild pulmonary edema with trace R pleural effusions, ground glass opacities in posterior right lower lobe (question edema vs infection/inflammation), bronchial wall thickening with mucus plugging, cardiomegaly and dilated IVC c/w elevated R heart pressures, coronary/aortic atherosclerosis and enlarged PA. Repeat CXR 4/15 showed worsening right basilar airspace opacities, atx versus infection.  She was treated with nebs, steroids, urea (Ure-Na) 15g BID, and IV Lasix with variable dosing, last got 40mg  IV yesterday. Abx were  discontinued due to negative procalcitonin. Last Na 129. TSH wnl. Echo this admission shows EF 60-65%, mild LVH, moderately elevated PASP, mild LAE, mod RAE, mild MR, moderate TR. Overall no major change from prior. I/O's not well tracked, weight down 10lb from admit. Cardiology is consulted as she was noted to have brief pauses on telemetry. She had some transient bradycardia in the upper 30s/low 40s during very early AM hours (6-7am), then daytime rates SB in the 50s-60s with rare transient pauses (mostly <2 sec, longest 2.4 seconds). She denies any dizziness, near-syncope, or syncope with these. She's been moderately hypertensive so amlodipine increased today. She is not on any AVN blocking agents. She feels her cough and dyspnea are much improved. Not yet fully back to baseline as she does have some residual cough. Lying 20 degrees flat without dyspnea. Main complaint now is constipation.   Past Medical History:  Diagnosis Date   Arthritis    Asthma    Breast cancer    Cancer    Basal cell skin cancer   Chronic diastolic CHF (congestive heart failure)    CKD stage 3b, GFR 30-44 ml/min    Colon polyp    GERD (gastroesophageal reflux disease)    History of shingles    Hypertension    Hypertensive heart disease 05/30/2020   Persistent atrial fibrillation    Pulmonary hypertension    Right groin pain 03/28/2013   Stress incontinence    Tricuspid regurgitation    Varicose veins    Vertigo     Past Surgical  History:  Procedure Laterality Date   ABDOMINAL HYSTERECTOMY  1972   BSO   BREAST SURGERY     lumpectomy   SKIN CANCER EXCISION  2014     Home Medications:  Prior to Admission medications   Medication Sig Start Date End Date Taking? Authorizing Provider  albuterol (VENTOLIN HFA) 108 (90 Base) MCG/ACT inhaler Inhale 1-2 puffs into the lungs every 6 (six) hours as needed for wheezing or shortness of breath. 12/17/22  Yes [provider]  amLODipine (NORVASC) 5 MG tablet  Take 5 mg by mouth daily.   Yes [provider]  ELIQUIS 2.5 MG TABS tablet TAKE ONE TABLET BY MOUTH TWICE DAILY Patient taking differently: Take 2.5 mg by mouth 2 (two) times daily. 08/17/22  Yes Karina Daub, MD  furosemide (LASIX) 20 MG tablet Take 1 tablet (20 mg total) by mouth 2 (two) times daily. 09/29/22  Yes Karina Daub, MD  guaiFENesin (MUCINEX) 600 MG 12 hr tablet Take 600 mg by mouth 2 (two) times daily as needed for to loosen phlegm.   Yes [provider]  nitroGLYCERIN (NITROSTAT) 0.4 MG SL tablet Place 1 tablet (0.4 mg total) under the tongue every 5 (five) minutes as needed for chest pain. 09/11/22  Yes Karina Daub, MD  ondansetron (ZOFRAN-ODT) 4 MG disintegrating tablet Take 4 mg by mouth every 8 (eight) hours. 12/17/22  Yes [provider]  predniSONE (STERAPRED UNI-PAK 21 TAB) 10 MG (21) TBPK tablet Take 10 mg by mouth daily. Take 6 tabs by mouth  daily on day 1, take  5 tabs by mouth  daily on day 2, 4 tabs by mouth  daily on day 3, 3 tabs by mouth  daily on day 4, 2 tabs by mouth  daily on day 5, 1 tab by mouth daily on day 6   Yes [provider]    Inpatient Medications: Scheduled Meds:  amLODipine  10 mg Oral Daily   apixaban  2.5 mg Oral BID   docusate sodium  100 mg Oral BID   mometasone-formoterol  2 puff Inhalation BID   predniSONE  20 mg Oral Q breakfast   sodium chloride flush  3 mL Intravenous Q12H   urea  15 g Oral BID   Continuous Infusions:  sodium chloride     PRN Meds: sodium chloride, acetaminophen, albuterol, benzonatate, bisacodyl, guaiFENesin, nitroGLYCERIN, ondansetron (ZOFRAN) IV, sodium chloride flush  Allergies:    Allergies  Allergen Reactions   Levofloxacin Nausea And Vomiting   Keflex [Cephalexin]     unknown   Prilosec [Omeprazole]     unknown   Sulfa Antibiotics     unknown   Sulfamethoxazole-Trimethoprim     Other reaction(s): rash   Azithromycin Rash   Penicillin G Rash   Penicillins  Rash    Social History:   Social History   Socioeconomic History   Marital status: Married    Spouse name: Not on file   Number of children: Not on file   Years of education: Not on file   Highest education level: Not on file  Occupational History   Not on file  Tobacco Use   Smoking status: Never   Smokeless tobacco: Never  Substance and Sexual Activity   Alcohol use: No   Drug use: No   Sexual activity: Not Currently    Birth control/protection: Abstinence  Other Topics Concern   Not on file  Social History Narrative   Not on file  Social Determinants of Health   Financial Resource Strain: Not on file  Food Insecurity: Not on file  Transportation Needs: Not on file  Physical Activity: Not on file  Stress: Not on file  Social Connections: Not on file  Intimate Partner Violence: Not on file    Family History:   Family History  Problem Relation Age of Onset   Stroke Mother    Heart disease Father    Cancer Maternal Grandmother        breast     ROS:  Please see the history of present illness.  All other ROS reviewed and negative.     Physical Exam/Data:   Vitals:   12/22/22 1723 12/22/22 2017 12/23/22 0441 12/23/22 0712  BP: (!) 164/78 (!) 145/81 (!) 151/64 (!) 162/67  Pulse: (!) 59 63 (!) 232 (!) 52  Resp: Temp: 97.9 F (36.6 C) 97.6 F (36.4 C) 97.6 F (36.4 C) 98.4 F (36.9 C)  TempSrc: Oral Oral Oral Oral  SpO2: 96% 97% 95% 97%  Weight:   65.5 kg     Intake/Output Summary (Last 24 hours) at 12/23/2022 1433 Last data filed at 12/23/2022 1300 Gross per 24 hour  Intake 480 ml  Output --  Net 480 ml      12/23/2022    4:41 AM 12/22/2022    4:31 AM 12/21/2022    5:00 AM  Last 3 Weights  Weight (lbs) 144 lb 6.4 oz 147 lb 14.9 oz 152 lb 5.4 oz  Weight (kg) 65.5 kg 67.1 kg 69.1 kg     Body mass index is 25.58 kg/m.  General: Well developed, well nourished elderly WF in no acute distress. Head: Normocephalic, atraumatic, sclera  non-icteric, no xanthomas, nares are without discharge. Neck: Negative for carotid bruits. JVP not elevated. Lungs: Clear bilaterally to auscultation without wheezes, rales, or rhonchi. Breathing is unlabored. Heart: irregularly irregular S1 S2, soft SEM LSB, no rubs rubs or gallops.  Abdomen: Soft, non-tender, non-distended with normoactive bowel sounds. No rebound/guarding. Extremities: No clubbing or cyanosis. No edema. Distal pedal pulses are 2+ and equal bilaterally. Neuro: Alert and oriented X 3. Moves all extremities spontaneously. Psych:  Responds to questions appropriately with a normal affect.   EKG:  The EKG was personally reviewed and demonstrates:   Initial: afib 65bpm, nonspecific STTW changes F/u today: atrial fib 63bpm, occasional PVC, nonspecific STTW changes  Telemetry:  Telemetry was personally reviewed and demonstrates:  as above  Relevant CV Studies: 2d echo 12/19/22    1. Left ventricular ejection fraction, by estimation, is 60 to 65%. The  left ventricle has normal function. The left ventricle has no regional  wall motion abnormalities. There is mild left ventricular hypertrophy.  Left ventricular diastolic function  could not be evaluated.   2. Right ventricular systolic function is mildly reduced. The right  ventricular size is normal. There is moderately elevated pulmonary artery  systolic pressure. The estimated right ventricular systolic pressure is  53.4 mmHg.   3. Left atrial size was mildly dilated.   4. Right atrial size was moderately dilated.   5. The mitral valve is normal in structure. Mild mitral valve  regurgitation. No evidence of mitral stenosis.   6. The tricuspid valve is abnormal. Tricuspid valve regurgitation is  moderate.   7. The aortic valve is tricuspid. Aortic valve regurgitation is not  visualized. No aortic stenosis is present.   8. The inferior vena cava is normal in size with <  50% respiratory  variability, suggesting right  atrial pressure of 8 mmHg.   Comparison(s): No significant change from prior study.   Laboratory Data:  High Sensitivity Troponin:   Recent Labs  Lab 12/18/22 1958 12/18/22 2220 12/19/22 1107 12/19/22 1427  TROPONINIHS 36* 37* 31* 32*     Chemistry Recent Labs  Lab 12/19/22 1107 12/19/22 1427 12/20/22 0444 12/20/22 1229 12/21/22 0054 12/21/22 1203 12/22/22 0436 12/22/22 0737 12/23/22 0027 12/23/22 0532 12/23/22 1149  NA 122*   < > 118*   < > 118*   < > 125*   < > 125* 132* 129*  K 3.9  --  4.2   < > 4.6  --  4.3  --   --  3.7  --   CL 82*  --  84*   < > 81*  --  86*  --   --  89*  --   CO2 27  --  27   < > 28  --  33*  --   --  33*  --   GLUCOSE 177*  --  163*   < > 133*  --  120*  --   --  112*  --   BUN 32*  --  33*   < > 35*  --  50*  --   --  40*  --   CREATININE 1.21*  --  1.05*   < > 1.09*  --  1.03*  --   --  0.95  --   CALCIUM 8.6*  --  8.1*   < > 8.6*  --  8.8*  --   --  8.7*  --   MG 1.7  --  2.4  --   --   --   --   --  2.4  --   --   GFRNONAA 41*  --  49*   < > 47*  --  50*  --   --  55*  --   ANIONGAP 13  --  7   < > 9  --  6  --   --  10  --    < > = values in this interval not displayed.    Recent Labs  Lab 12/18/22 1934  PROT 7.0  ALBUMIN 3.3*  AST 46*  ALT 35  ALKPHOS 78  BILITOT 0.7   Lipids No results for input(s): "CHOL", "TRIG", "HDL", "LABVLDL", "LDLCALC", "CHOLHDL" in the last 168 hours.  Hematology Recent Labs  Lab 12/21/22 0054 12/22/22 0436 12/23/22 0532  WBC 7.6 8.0 8.2  RBC 3.33* 3.87 3.60*  HGB 10.4* 11.9* 11.2*  HCT 29.5* 34.2* 32.1*  MCV 88.6 88.4 89.2  MCH 31.2 30.7 31.1  MCHC 35.3 34.8 34.9  RDW 12.8 12.9 12.9  PLT 164 185 198   Thyroid  Recent Labs  Lab 12/21/22 1203  TSH 2.845    BNP Recent Labs  Lab 12/18/22 1958  BNP 599.1*    DDimer No results for input(s): "DDIMER" in the last 168 hours.   Radiology/Studies:  DG Chest Port 1 View  Result Date: 12/21/2022 CLINICAL DATA:  [id number] Congestive  heart failure (CHF) [id number] EXAM: PORTABLE CHEST 1 VIEW COMPARISON:  Radiograph 12/18/2022 FINDINGS: Unchanged cardiomegaly. Bibasilar atelectasis. Trace right pleural effusion. Left peripheral basilar pleural thickening, unchanged. Increased right basilar airspace disease. No pneumothorax. Bones are unchanged. IMPRESSION: Increased right basilar airspace opacities, could be atelectasis or developing infection. Electronically Signed   By: Erma Heritage.D.  On: 12/21/2022 09:55   ECHOCARDIOGRAM COMPLETE  Result Date: 12/19/2022    ECHOCARDIOGRAM REPORT   Patient Name:   REBAKAH COKLEY Date of Exam: 12/19/2022 Medical Rec #:  [id number]        Height:       63.0 in Accession #:    [id number]       Weight:       154.3 lb Date of Birth:  [birth year]        BSA:          1.732 m Patient Age:    89 years         BP:           155/77 mmHg Patient Gender: F                HR:           81 bpm. Exam Location:  Inpatient Procedure: 2D Echo, Cardiac Doppler and Color Doppler Indications:    A flutter  History:        Patient has prior history of Echocardiogram examinations, most                 recent 06/20/2020. CHF, COPD, Signs/Symptoms:Chest Pain; Risk                 Factors:Hypertension.  Sonographer:    Melissa Morford RDCS (AE, PE) Referring Phys: [id number] JARED M GARDNER IMPRESSIONS  1. Left ventricular ejection fraction, by estimation, is 60 to 65%. The left ventricle has normal function. The left ventricle has no regional wall motion abnormalities. There is mild left ventricular hypertrophy. Left ventricular diastolic function could not be evaluated.  2. Right ventricular systolic function is mildly reduced. The right ventricular size is normal. There is moderately elevated pulmonary artery systolic pressure. The estimated right ventricular systolic pressure is 53.4 mmHg.  3. Left atrial size was mildly dilated.  4. Right atrial size was moderately dilated.  5. The mitral valve is normal in structure. Mild mitral  valve regurgitation. No evidence of mitral stenosis.  6. The tricuspid valve is abnormal. Tricuspid valve regurgitation is moderate.  7. The aortic valve is tricuspid. Aortic valve regurgitation is not visualized. No aortic stenosis is present.  8. The inferior vena cava is normal in size with <50% respiratory variability, suggesting right atrial pressure of 8 mmHg. Comparison(s): No significant change from prior study. FINDINGS  Left Ventricle: Left ventricular ejection fraction, by estimation, is 60 to 65%. The left ventricle has normal function. The left ventricle has no regional wall motion abnormalities. The left ventricular internal cavity size was normal in size. There is  mild left ventricular hypertrophy. Left ventricular diastolic function could not be evaluated due to atrial fibrillation. Left ventricular diastolic function could not be evaluated. Right Ventricle: The right ventricular size is normal. No increase in right ventricular wall thickness. Right ventricular systolic function is mildly reduced. There is moderately elevated pulmonary artery systolic pressure. The tricuspid regurgitant velocity is 3.37 m/s, and with an assumed right atrial pressure of 8 mmHg, the estimated right ventricular systolic pressure is 53.4 mmHg. Left Atrium: Left atrial size was mildly dilated. Right Atrium: Right atrial size was moderately dilated. Pericardium: There is no evidence of pericardial effusion. Mitral Valve: The mitral valve is normal in structure. Mild mitral valve regurgitation. No evidence of mitral valve stenosis. Tricuspid Valve: The tricuspid valve is abnormal. Tricuspid valve regurgitation is moderate . No evidence of tricuspid stenosis. Aortic Valve: The aortic valve  is tricuspid. Aortic valve regurgitation is not visualized. No aortic stenosis is present. Pulmonic Valve: The pulmonic valve was normal in structure. Pulmonic valve regurgitation is not visualized. No evidence of pulmonic stenosis.  Aorta: The aortic root and ascending aorta are structurally normal, with no evidence of dilitation. Venous: The inferior vena cava is normal in size with less than 50% respiratory variability, suggesting right atrial pressure of 8 mmHg. IAS/Shunts: No atrial level shunt detected by color flow Doppler.  LEFT VENTRICLE PLAX 2D LVIDd:         3.80 cm LVIDs:         2.80 cm LV PW:         1.20 cm LV IVS:        1.10 cm LVOT diam:     1.90 cm LV SV:         47 LV SV Index:   27 LVOT Area:     2.84 cm  LV Volumes (MOD) LV vol d, MOD A2C: 84.7 ml LV vol d, MOD A4C: 58.5 ml LV vol s, MOD A2C: 35.0 ml LV vol s, MOD A4C: 35.2 ml LV SV MOD A2C:     49.7 ml LV SV MOD A4C:     58.5 ml LV SV MOD BP:      34.5 ml RIGHT VENTRICLE RV S prime:     9.90 cm/s TAPSE (M-mode): 1.6 cm LEFT ATRIUM             Index        RIGHT ATRIUM           Index LA diam:        4.10 cm 2.37 cm/m   RA Area:     18.00 cm LA Vol (A2C):   81.8 ml 47.23 ml/m  RA Volume:   47.30 ml  27.31 ml/m LA Vol (A4C):   49.0 ml 28.29 ml/m LA Biplane Vol: 63.3 ml 36.55 ml/m  AORTIC VALVE LVOT Vmax:   81.30 cm/s LVOT Vmean:  64.600 cm/s LVOT VTI:    0.165 m  AORTA Ao Root diam: 3.40 cm Ao Asc diam:  3.40 cm MITRAL VALVE                TRICUSPID VALVE MV Area (PHT): 4.33 cm     TR Peak grad:   45.4 mmHg MV Decel Time: 175 msec     TR Vmax:        337.00 cm/s MV E velocity: 112.00 cm/s                             SHUNTS                             Systemic VTI:  0.16 m                             Systemic Diam: 1.90 cm Vishnu Priya Mallipeddi Electronically signed by Winfield Rast Mallipeddi Signature Date/Time: 12/19/2022/3:32:05 PM    Final      Assessment and Plan:   1. Acute asthma exacerbation in setting of + parainfluenza virus, hyponatremia - CT with mucus plugging, fluid as well - complicated by hyponatremia with workup pointing towards SIADH and low solute intake as well, nephrology following  2. Acute on chronic diastolic CHF - volume status  improved s/p IV  Lasix, held today - per nephrology can resume on DC - follow daily  3. Persistent (likely permanent) atrial fibrillation with episodic bradycardia, pauses - baseline HR 60s, here 50s-60s with rare transient pauses <2.5 sec - longest seemed to be 2.4 seconds, some bradycardia in the upper 30s/low 40s during very early AM hours as well - asymptomatic - likely has a degree of underlying conduction disease since she otherwise had not previously required any agents for rate control but given lack of symptoms, advanced age, does not appear intervention needed at this time, no urgent indication for PPM - TSH wnl - patient meets criteria for regular apixaban dosing but has been recommended for 2.5mg  BID by primary cardiologist given age/decreased GFR  4. HTN - primary team addressing, possibly some contribution from steroids, amlodipine increased  5. Coronary/aortic atherosclerosis - no anginal symptoms, EF normal - would not advise starting ASA due to concomitant Eliquis - can defer statin consideration to outpt setting   Risk Assessment/Risk Scores:      New York Heart Association (NYHA) Functional Class NYHA Class II-III  CHA2DS2-VASc Score =     This indicates a  % annual risk of stroke. The patient's score is based upon:        For questions or updates, please contact [facility] Please consult [email or web address] for contact info under    Signed, Laurann Montana, PA-C  12/23/2022 2:33 PM   ATTENDING ATTESTATION:  After conducting a review of all available clinical information with the care team, interviewing the patient, and performing a physical exam, I agree with the findings and plan described in this note.   GEN: No acute distress, frail HEENT: Dry MM, no JVD, no scleral icterus Cardiac: RRR, no murmurs, rubs, or gallops.  Respiratory: Coarse at both bases GI: Soft, nontender, non-distended  MS: No edema; No deformity. Neuro:  Nonfocal  Vasc:  +2  radial pulses  Patient is a 89 year old female with a history of persistent atrial fibrillation on low-dose Eliquis, chronic kidney disease stage III, chronic diastolic heart failure and asthma who was admitted due to shortness of breath.  She has been been treated for respiratory infection with doxycycline and prednisone.  Additionally her course been complicated with hyponatremia thought to be due to SIADH which is being managed by nephrology.  We are seeing the patient due to asymptomatic pauses.  These typically occur during morning hours.  The patient is a completely asymptomatic.  Her biggest complaint is constipation.  She does have a history of relatively rate controlled atrial fibrillation.  This point in time we will continue to monitor the patient's rate and rhythm as supportive care is being administered.   Alverda Skeans, MD Pager [phone]

## 2022-12-23 NOTE — Progress Notes (Signed)
   12/23/22 1214  Mobility  Activity Ambulated with assistance in hallway  Level of Assistance Standby assist, set-up cues, supervision of patient - no hands on  Assistive Device Front wheel walker  Distance Ambulated (ft) 250 ft  Activity Response Tolerated well  Mobility Referral Yes  $Mobility charge 1 Mobility   Mobility Specialist Progress Note  Pt was in bed and agreeable. Had no c/o pain. Left EOB w/ all needs met and call bell in reach.  Anastasia Pall Mobility Specialist  Please contact via SecureChat or Rehab office at [phone]

## 2022-12-23 NOTE — TOC Progression Note (Addendum)
Transition of Care [facility]) - Progression Note    Patient Details  Name: Karina Chase MRN: [id number] Date of Birth: December 24, 1924  [facility] CM/SW Contact  Carley Hammed, [place] Phone Number: 12/23/2022, 9:51 AM  Clinical Narrative:     CSW received a call from [place] with [facility] in [place]. Per [facility], pt's family is friends with the owner of [facility], and they will offer a bed. Tracey requested CSW run authorization for SNF. CSW advised that it will likely be declined, as pt does not clinically require SNF level therapies. [facility] noted understanding, and stated they would have a bed either way. CSW started authorization, it is pending at this time, will need to update FL2 to reflect change. CSW spoke with dtr who stated she plans on appealing the insurance if a denial is issued. TOC will continue to follow for DC needs.    Insurance is approved, Librarian, academic and family notified. Possible DC tomorrow if medically stable. Will discuss transport with dtr.     Expected Discharge Plan and Services                                               Social Determinants of Health (SDOH) Interventions SDOH Screenings   Tobacco Use: Low Risk  (12/18/2022)    Readmission Risk Interventions     No data to display

## 2022-12-23 NOTE — Progress Notes (Signed)
PROGRESS NOTE Karina Chase  ZOX:[id number] DOB: 08-30-1925 DOA: 12/18/2022 PCP: Baldo Daub, MD  Brief Narrative/Hospital Course: 89 year old female with past medical history of persistent A-fib, on chronic anticoagulation, hypertensive heart disease with chronic diastolic CHF, CKD stage III, asthma present here with complaining of shortness of breath.  Her cardiologist recently increased Lasix to twice daily back in March.  She presented with cough, shortness of breath since 1 week.  She was given doxycycline and prednisone by her PCP however symptoms progressively worse therefore she came to the ER for further eval and management. Her imaging showed: Ground-glass opacities in the posterior right lobe which could be due to edema or infection/inflammation.  Bronchial wall thickening and mucous plugging in lower lobes.  Cardiomegaly and dilated IVC.  Enlargement pulmonary artery seen and pulmonary hypertension.  Patient was given a dose of Lasix and admitted for further eval and management of CHF exacerbation. 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 Seen by nephrology 4/15 placed on URE-NA 15 g twice daily and serial monitoring of sodium and improving    Subjective: Seen and examined this morning  Informed by nurse that patient having +0.1, 2.2-second But asymptomatic vital stable EKG shows A-fib heart rate in 60s Complains of cough here and there but reports "no more brain fog", overall feels much better, This morning went to the bathroom and came back   Assessment and Plan: Principal Problem:   Acute on chronic diastolic CHF (congestive heart failure) Active Problems:   Hyponatremia   Atrial fibrillation   Acute on chronic Hyponatremia 2/2 SIADH:sodium previously normal at 140 and 12/13/2020.Hyponatremia due to SIADHs,sodium 120 on admission.Low serum osmolality,urine sodium about 40,urine osmolality above 100-appreciate nephrology  input on fluid restriction 1200 cc,URE-NA 15 g bid > now on hold sodium improved to 132 continue nephrology plan Recent Labs  Lab 12/22/22 0737 12/22/22 1154 12/22/22 1739 12/23/22 0027 12/23/22 0532  NA 127* 125* 126* 125* 132*   Persistent A-fib on Eliquis. Slow ventricular response Pauses 2.1, 2.2-second: Not on beta-blocker, although had pauses asymptomatic, currently has been consulted this morning and discussed with Dr. Roby Lofts.Will replace potassium to maintain at 4, check mag level   Acute on chronic diastolic WJX:BJYNWGNFA with worsening cough and shortness of breath.  BNP elevated at 599.CT chest done in ED-showing groundglass opacity seen in the posterior right lower lobe.being managed with IV Lasix, clinically much better.TTE with preserved EF unable to estimate diastolic function similar to prior.CXR 4/15- showed rt basilar opacities increased cont IS continue to monitor intake output, daily weight, continue fluid restriction. She is net negative with improving weight as below: Net IO Since Admission: -2,270 mL [12/23/22 0812]  Filed Weights   12/21/22 0500 12/22/22 0431 12/23/22 0441  Weight: 69.1 kg 67.1 kg 65.5 kg   Acute asthma exacerbation Parainfluenza +: Tested negative for COVID flu and RSV.  Breathing status much better wheezing improving continue on low-dose prednisone 20 mg, Dulera, bronchodilators.Doxy was discontinued since procalcitonin was negative.    Elevated troponin: Patient denies ACS symptoms.  Likely demand ischemia.  Troponin was slightly elevated but flat   Hypertensive heart disease: BP remains controlled onamlodipine.   AKI on CKD stage IIIa: Baseline creatinine 1.1  peaked to 1.3> now at baseline, improved to 0.9 as below appreciate nephrO Recent Labs    12/18/22 1843 12/18/22 1934 12/19/22 1107 12/20/22 0444 12/20/22 1229 12/21/22 0054 12/22/22 0436 12/23/22 0532  BUN 30* 27* 32* 33*  33* 35* 50* 40*  CREATININE 1.30* 1.32* 1.21* 1.05*  1.06* 1.09* 1.03* 0.95     Normocytic anemia: hb is stable , cont to monitor Recent Labs  Lab 12/19/22 1107 12/20/22 0444 12/21/22 0054 12/22/22 0436 12/23/22 0532  HGB 10.2* 9.5* 10.4* 11.9* 11.2*  HCT 30.5* 27.3* 29.5* 34.2* 32.1*      Thrombocytopenia: Resolved.   Recent Labs  Lab 12/19/22 1107 12/20/22 0444 12/21/22 0054 12/22/22 0436 12/23/22 0532  PLT 141* 124* 164 185 198     Lactic acid is 2.4 on 4/13 resolved  Deconditioning/debility weakness improving appreciate PT OT input advised home health PT OT, daughter wondering about short-term rehab versus ALF since patient lives alone and family unable to provide 24/7 care TOC following.  Goals of care CODE STATUS DNR, patient has been living independently, given advanced age, monitor closely at risk of decompensation and readmission  DVT prophylaxis: apixaban (ELIQUIS) tablet 2.5 mg Start: 12/18/22 2245 Code Status:   Code Status: DNR Family Communication: plan of care discussed with patient/daughter at bedside. Updated daughter on the phone.  Patient status is:  inpatient  because of hyponatremia Level of care: Telemetry Medical  Dispo:The patient is from: home lives alone and independent.           Anticipated disposition:TBD family wants to pursue SNF.  Objective: Vitals last 24 hrs: Vitals:   12/22/22 1723 12/22/22 2017 12/23/22 0441 12/23/22 0712  BP: (!) 164/78 (!) 145/81 (!) 151/64 (!) 162/67  Pulse: (!) 59 63 (!) 232 (!) 52  Resp: Temp: 97.9 F (36.6 C) 97.6 F (36.4 C) 97.6 F (36.4 C) 98.4 F (36.9 C)  TempSrc: Oral Oral Oral Oral  SpO2: 96% 97% 95% 97%  Weight:   65.5 kg    Weight change: -1.6 kg  Physical Examination: General exam: alert awake, oriented x 3, elderly, frail. HEENT:Oral mucosa moist, Ear/Nose WNL grossly Respiratory system: Bilaterally clear BS, no use of accessory muscle Cardiovascular system: S1 & S2 +, No JVD. Gastrointestinal system: Abdomen soft,NT,ND,  BS+ Nervous System: Alert, awake, moving extremities, she follows commands. Extremities: LE edema neg,distal peripheral pulses palpable.  Skin: No rashes,no icterus. MSK: Normal muscle bulk,tone, power  Medications reviewed:  Scheduled Meds:  amLODipine  10 mg Oral Daily   apixaban  2.5 mg Oral BID   docusate sodium  100 mg Oral BID   mometasone-formoterol  2 puff Inhalation BID   predniSONE  20 mg Oral Q breakfast   sodium chloride flush  3 mL Intravenous Q12H   Continuous Infusions:  sodium chloride     potassium chloride      Diet Order             DIET DYS 3 Room service appropriate? Yes with Assist; Fluid consistency: Thin; Fluid restriction: 1200 mL Fluid  Diet effective now                   Intake/Output Summary (Last 24 hours) at 12/23/2022 1610 Last data filed at 12/22/2022 1600 Gross per 24 hour  Intake 240 ml  Output --  Net 240 ml    Net IO Since Admission: -2,270 mL [12/23/22 0812]  Wt Readings from Last 3 Encounters:  12/23/22 65.5 kg  10/14/22 63.5 kg  04/10/22 60.8 kg     Unresulted Labs (From admission, onward)     Start     Ordered   12/23/22 0709  Magnesium  Add-on,   AD  12/23/22 0708   12/22/22 1800  Sodium  Now then every 6 hours,   R (with TIMED occurrences)      12/22/22 1217   12/22/22 0500  CBC  Daily,   R      12/21/22 0947   12/20/22 0500  Basic metabolic panel  Daily,   R     Comments: As Scheduled for 5 days    12/19/22 0146          Data Reviewed: I have personally reviewed following labs and imaging studies CBC: Recent Labs  Lab 12/18/22 1934 12/19/22 1107 12/20/22 0444 12/21/22 0054 12/22/22 0436 12/23/22 0532  WBC 4.5 3.9* 3.5* 7.6 8.0 8.2  NEUTROABS 3.4  --   --   --   --   --   HGB 10.0* 10.2* 9.5* 10.4* 11.9* 11.2*  HCT 30.2* 30.5* 27.3* 29.5* 34.2* 32.1*  MCV 93.5 92.1 90.1 88.6 88.4 89.2  PLT 141* 141* 124* 164 185 198    Basic Metabolic Panel: Recent Labs  Lab 12/19/22 1107 12/19/22 1427  12/20/22 0444 12/20/22 1229 12/21/22 0054 12/21/22 1203 12/22/22 0436 12/22/22 0737 12/22/22 1154 12/22/22 1739 12/23/22 0027 12/23/22 0532  NA 122*   < > 118* 121* 118*   < > 125* 127* 125* 126* 125* 132*  K 3.9  --  4.2 4.5 4.6  --  4.3  --   --   --   --  3.7  CL 82*  --  84* 83* 81*  --  86*  --   --   --   --  89*  CO2 27  --  27 28 28   --  33*  --   --   --   --  33*  GLUCOSE 177*  --  163* 142* 133*  --  120*  --   --   --   --  112*  BUN 32*  --  33* 33* 35*  --  50*  --   --   --   --  40*  CREATININE 1.21*  --  1.05* 1.06* 1.09*  --  1.03*  --   --   --   --  0.95  CALCIUM 8.6*  --  8.1* 8.7* 8.6*  --  8.8*  --   --   --   --  8.7*  MG 1.7  --  2.4  --   --   --   --   --   --   --   --   --    < > = values in this interval not displayed.    GFR: Estimated Creatinine Clearance: 32.2 mL/min (by C-G formula based on SCr of 0.95 mg/dL). Liver Function Tests: Recent Labs  Lab 12/18/22 1934  AST 46*  ALT 35  ALKPHOS 78  BILITOT 0.7  PROT 7.0  ALBUMIN 3.3*   Sepsis Labs: Recent Labs  Lab 12/18/22 1958 12/19/22 1107 12/19/22 1427 12/20/22 0955  PROCALCITON  --  <0.10  --   --   LATICACIDVEN 1.0 1.7 2.4* 1.5     Recent Results (from the past 240 hour(s))  Resp panel by RT-PCR (RSV, Flu A&B, Covid) Anterior Nasal Swab     Status: None   Collection Time: 12/18/22  8:06 PM   Specimen: Anterior Nasal Swab  Result Value Ref Range Status   SARS Coronavirus 2 by RT PCR NEGATIVE NEGATIVE Final   Influenza A by PCR NEGATIVE NEGATIVE Final  Influenza B by PCR NEGATIVE NEGATIVE Final    Comment: (NOTE) The Xpert Xpress SARS-CoV-2/FLU/RSV plus assay is intended as an aid in the diagnosis of influenza from Nasopharyngeal swab specimens and should not be used as a sole basis for treatment. Nasal washings and aspirates are unacceptable for Xpert Xpress SARS-CoV-2/FLU/RSV testing.  Fact Sheet for Patients: BloggerCourse.com  Fact Sheet for  Healthcare Providers: SeriousBroker.it  This test is not yet approved or cleared by the [place] FDA and has been authorized for detection and/or diagnosis of SARS-CoV-2 by FDA under an Emergency Use Authorization (EUA). This EUA will remain in effect (meaning this test can be used) for the duration of the COVID-19 declaration under Section 564(b)(1) of the Act, 21 U.S.C. section 360bbb-3(b)(1), unless the authorization is terminated or revoked.     Resp Syncytial Virus by PCR NEGATIVE NEGATIVE Final    Comment: (NOTE) Fact Sheet for Patients: BloggerCourse.com  Fact Sheet for Healthcare Providers: SeriousBroker.it  This test is not yet approved or cleared by the [place] FDA and has been authorized for detection and/or diagnosis of SARS-CoV-2 by FDA under an Emergency Use Authorization (EUA). This EUA will remain in effect (meaning this test can be used) for the duration of the COVID-19 declaration under Section 564(b)(1) of the Act, 21 U.S.C. section 360bbb-3(b)(1), unless the authorization is terminated or revoked.  Performed at [facility], [address], [place], [place] [address]   Respiratory (~20 pathogens) panel by PCR     Status: Abnormal   Collection Time: 12/20/22  3:11 PM   Specimen: Nasopharyngeal Swab; Respiratory  Result Value Ref Range Status   Adenovirus NOT DETECTED NOT DETECTED Final   Coronavirus 229E NOT DETECTED NOT DETECTED Final    Comment: (NOTE) The Coronavirus on the Respiratory Panel, DOES NOT test for the novel  Coronavirus (2019 nCoV)    Coronavirus HKU1 NOT DETECTED NOT DETECTED Final   Coronavirus NL63 NOT DETECTED NOT DETECTED Final   Coronavirus OC43 NOT DETECTED NOT DETECTED Final   Metapneumovirus NOT DETECTED NOT DETECTED Final   Rhinovirus / Enterovirus NOT DETECTED NOT DETECTED Final   Influenza A NOT DETECTED NOT DETECTED Final   Influenza  B NOT DETECTED NOT DETECTED Final   Parainfluenza Virus 1 NOT DETECTED NOT DETECTED Final   Parainfluenza Virus 2 NOT DETECTED NOT DETECTED Final   Parainfluenza Virus 3 DETECTED (A) NOT DETECTED Final   Parainfluenza Virus 4 NOT DETECTED NOT DETECTED Final   Respiratory Syncytial Virus NOT DETECTED NOT DETECTED Final   Bordetella pertussis NOT DETECTED NOT DETECTED Final   Bordetella Parapertussis NOT DETECTED NOT DETECTED Final   Chlamydophila pneumoniae NOT DETECTED NOT DETECTED Final   Mycoplasma pneumoniae NOT DETECTED NOT DETECTED Final    Comment: Performed at [facility], [address], [place], [place] [address]    Antimicrobials: Anti-infectives (From admission, onward)    Start     Dose/Rate Route Frequency Ordered Stop   12/19/22 1145  doxycycline (VIBRA-TABS) tablet 100 mg  Status:  Discontinued        100 mg Oral Every 12 hours 12/19/22 1051 12/20/22 0850      Culture/Microbiology No results found for: "SDES", "SPECREQUEST", "CULT", "REPTSTATUS"  Other culture-see note  Radiology Studies: DG Chest Port 1 View  Result Date: 12/21/2022 CLINICAL DATA:  [id number] Congestive heart failure (CHF) [id number] EXAM: PORTABLE CHEST 1 VIEW COMPARISON:  Radiograph 12/18/2022 FINDINGS: Unchanged cardiomegaly. Bibasilar atelectasis. Trace right pleural effusion. Left peripheral basilar pleural  thickening, unchanged. Increased right basilar airspace disease. No pneumothorax. Bones are unchanged. IMPRESSION: Increased right basilar airspace opacities, could be atelectasis or developing infection. Electronically Signed   By: Caprice Renshaw M.D.   On: 12/21/2022 09:55     LOS: 4 days   Lanae Boast, MD [facility]  12/23/2022, 8:12 AM

## 2022-12-23 NOTE — NC FL2 (Signed)
MEDICAID FL2 LEVEL OF CARE FORM     IDENTIFICATION  Patient Name: Karina Chase Birthdate: 03-13-1925 Sex: female Admission Date (Current Location): 12/18/2022  [facility] and [place] Number:  Producer, television/film/video and Address:  [facility], [address], [place], [place] [address]      Provider Number: [id number]  Attending Physician Name and Address:  Lanae Boast, MD  Relative Name and Phone Number:  Audria Nine, [phone]    Current Level of Care: Hospital Recommended Level of Care: Skilled Nursing Facility Prior Approval Number:    Date Approved/Denied:   PASRR Number: [id number]  Discharge Plan: SNF (ALF)    Current Diagnoses: Patient Active Problem List   Diagnosis Date Noted   Acute on chronic diastolic CHF (congestive heart failure) 12/19/2022   Hyponatremia 12/18/2022   Chronic kidney disease 12/13/2020   Generalized edema 12/13/2020   Hearing loss 12/13/2020   Overactive bladder 12/13/2020   Type 2 diabetes mellitus without complications 12/13/2020   Arthritis    Asthma    Atrial fibrillation    Breast cancer    Cancer    Colon polyp    GERD (gastroesophageal reflux disease)    History of shingles    Hypertension    Stress incontinence    Vertigo    Hypertensive heart disease 05/30/2020   Right groin pain 03/28/2013    Orientation RESPIRATION BLADDER Height & Weight     Self, Time, Situation, Place  Normal Continent Weight: 144 lb 6.4 oz (65.5 kg) Height:     BEHAVIORAL SYMPTOMS/MOOD NEUROLOGICAL BOWEL NUTRITION STATUS      Continent Diet (Dysphagia 3, thin liquids)  AMBULATORY STATUS COMMUNICATION OF NEEDS Skin   Supervision Verbally Normal                       Personal Care Assistance Level of Assistance  Bathing, Feeding, Dressing Bathing Assistance: Limited assistance Feeding assistance: Independent Dressing Assistance: Limited assistance     Functional Limitations Info  Sight,  Hearing, Speech Sight Info: Impaired (Reading Glasses) Hearing Info: Impaired Speech Info: Adequate    SPECIAL CARE FACTORS FREQUENCY  PT (By licensed PT), OT (By licensed OT)     PT Frequency: HH Recommended 3x week OT Frequency: HH Recommended 3x week            Contractures Contractures Info: Not present    Additional Factors Info  Code Status, Allergies Code Status Info: DNR Allergies Info: Levofloxacin  Keflex (Cephalexin)  Prilosec (Omeprazole)  Sulfa Antibiotics  Sulfamethoxazole-trimethoprim  Azithromycin  Penicillin G  Penicillins           Current Medications (12/23/2022):  This is the current hospital active medication list Current Facility-Administered Medications  Medication Dose Route Frequency Provider Last Rate Last Admin   0.9 %  sodium chloride infusion  250 mL Intravenous PRN Hillary Bow, DO       acetaminophen (TYLENOL) tablet 650 mg  650 mg Oral Q4H PRN Hillary Bow, DO       albuterol (PROVENTIL) (2.5 MG/3ML) 0.083% nebulizer solution 2.5 mg  2.5 mg Nebulization Q2H PRN Vann, Dijon Kohlman U, DO       amLODipine (NORVASC) tablet 10 mg  10 mg Oral Daily Anthony Sar, MD   10 mg at 12/23/22 0816   apixaban (ELIQUIS) tablet 2.5 mg  2.5 mg Oral BID Hillary Bow, DO   2.5 mg at 12/23/22 0816   benzonatate (TESSALON)  capsule 200 mg  200 mg Oral TID PRN Hillary Bow, DO   200 mg at 12/21/22 2113   bisacodyl (DULCOLAX) suppository 10 mg  10 mg Rectal Daily PRN Joseph Art, DO   10 mg at 12/23/22 1122   docusate sodium (COLACE) capsule 100 mg  100 mg Oral BID Marlin Canary U, DO   100 mg at 12/23/22 0816   guaiFENesin (ROBITUSSIN) 100 MG/5ML liquid 5 mL  5 mL Oral Q4H PRN Marlin Canary U, DO   5 mL at 12/22/22 1852   mometasone-formoterol (DULERA) 200-5 MCG/ACT inhaler 2 puff  2 puff Inhalation BID Hillary Bow, DO   2 puff at 12/23/22 0817   nitroGLYCERIN (NITROSTAT) SL tablet 0.4 mg  0.4 mg Sublingual Q5 min PRN Pahwani, Rinka R, MD        ondansetron (ZOFRAN) injection 4 mg  4 mg Intravenous Q6H PRN Julian Reil, Jared M, DO       predniSONE (DELTASONE) tablet 20 mg  20 mg Oral Q breakfast Kc, Ramesh, MD   20 mg at 12/23/22 0816   sodium chloride flush (NS) 0.9 % injection 3 mL  3 mL Intravenous Q12H Lyda Perone M, DO   3 mL at 12/22/22 2142   sodium chloride flush (NS) 0.9 % injection 3 mL  3 mL Intravenous PRN Hillary Bow, DO         Discharge Medications: Please see discharge summary for a list of discharge medications.  Relevant Imaging Results:  Relevant Lab Results:   Additional Information SS# [id number]

## 2022-12-24 DIAGNOSIS — E871 Hypo-osmolality and hyponatremia: Secondary | ICD-10-CM | POA: Diagnosis not present

## 2022-12-24 DIAGNOSIS — E119 Type 2 diabetes mellitus without complications: Secondary | ICD-10-CM | POA: Diagnosis not present

## 2022-12-24 DIAGNOSIS — I5032 Chronic diastolic (congestive) heart failure: Secondary | ICD-10-CM | POA: Diagnosis not present

## 2022-12-24 DIAGNOSIS — I110 Hypertensive heart disease with heart failure: Secondary | ICD-10-CM | POA: Diagnosis not present

## 2022-12-24 DIAGNOSIS — C50919 Malignant neoplasm of unspecified site of unspecified female breast: Secondary | ICD-10-CM | POA: Diagnosis not present

## 2022-12-24 DIAGNOSIS — Z7901 Long term (current) use of anticoagulants: Secondary | ICD-10-CM | POA: Diagnosis not present

## 2022-12-24 DIAGNOSIS — R1311 Dysphagia, oral phase: Secondary | ICD-10-CM | POA: Diagnosis not present

## 2022-12-24 DIAGNOSIS — E46 Unspecified protein-calorie malnutrition: Secondary | ICD-10-CM | POA: Diagnosis not present

## 2022-12-24 DIAGNOSIS — I130 Hypertensive heart and chronic kidney disease with heart failure and stage 1 through stage 4 chronic kidney disease, or unspecified chronic kidney disease: Secondary | ICD-10-CM | POA: Diagnosis not present

## 2022-12-24 DIAGNOSIS — M2550 Pain in unspecified joint: Secondary | ICD-10-CM | POA: Diagnosis not present

## 2022-12-24 DIAGNOSIS — R2689 Other abnormalities of gait and mobility: Secondary | ICD-10-CM | POA: Diagnosis not present

## 2022-12-24 DIAGNOSIS — N179 Acute kidney failure, unspecified: Secondary | ICD-10-CM | POA: Diagnosis not present

## 2022-12-24 DIAGNOSIS — I4891 Unspecified atrial fibrillation: Secondary | ICD-10-CM | POA: Diagnosis not present

## 2022-12-24 DIAGNOSIS — R601 Generalized edema: Secondary | ICD-10-CM | POA: Diagnosis not present

## 2022-12-24 DIAGNOSIS — J45909 Unspecified asthma, uncomplicated: Secondary | ICD-10-CM | POA: Diagnosis not present

## 2022-12-24 DIAGNOSIS — R262 Difficulty in walking, not elsewhere classified: Secondary | ICD-10-CM | POA: Diagnosis not present

## 2022-12-24 DIAGNOSIS — I4819 Other persistent atrial fibrillation: Secondary | ICD-10-CM | POA: Diagnosis not present

## 2022-12-24 DIAGNOSIS — M1990 Unspecified osteoarthritis, unspecified site: Secondary | ICD-10-CM | POA: Diagnosis not present

## 2022-12-24 DIAGNOSIS — D649 Anemia, unspecified: Secondary | ICD-10-CM | POA: Diagnosis not present

## 2022-12-24 DIAGNOSIS — H9190 Unspecified hearing loss, unspecified ear: Secondary | ICD-10-CM | POA: Diagnosis not present

## 2022-12-24 DIAGNOSIS — K219 Gastro-esophageal reflux disease without esophagitis: Secondary | ICD-10-CM | POA: Diagnosis not present

## 2022-12-24 DIAGNOSIS — R531 Weakness: Secondary | ICD-10-CM | POA: Diagnosis not present

## 2022-12-24 DIAGNOSIS — I119 Hypertensive heart disease without heart failure: Secondary | ICD-10-CM | POA: Diagnosis not present

## 2022-12-24 DIAGNOSIS — I5033 Acute on chronic diastolic (congestive) heart failure: Secondary | ICD-10-CM | POA: Diagnosis not present

## 2022-12-24 DIAGNOSIS — D6859 Other primary thrombophilia: Secondary | ICD-10-CM | POA: Diagnosis not present

## 2022-12-24 DIAGNOSIS — Z743 Need for continuous supervision: Secondary | ICD-10-CM | POA: Diagnosis not present

## 2022-12-24 DIAGNOSIS — I10 Essential (primary) hypertension: Secondary | ICD-10-CM | POA: Diagnosis not present

## 2022-12-24 DIAGNOSIS — R54 Age-related physical debility: Secondary | ICD-10-CM | POA: Diagnosis not present

## 2022-12-24 DIAGNOSIS — N183 Chronic kidney disease, stage 3 unspecified: Secondary | ICD-10-CM | POA: Diagnosis not present

## 2022-12-24 DIAGNOSIS — R2681 Unsteadiness on feet: Secondary | ICD-10-CM | POA: Diagnosis not present

## 2022-12-24 DIAGNOSIS — M6281 Muscle weakness (generalized): Secondary | ICD-10-CM | POA: Diagnosis not present

## 2022-12-24 LAB — CBC
HCT: 35.3 % — ABNORMAL LOW (ref 36.0–46.0)
Hemoglobin: 11.9 g/dL — ABNORMAL LOW (ref 12.0–15.0)
MCH: 30.7 pg (ref 26.0–34.0)
MCHC: 33.7 g/dL (ref 30.0–36.0)
MCV: 91 fL (ref 80.0–100.0)
Platelets: 247 10*3/uL (ref 150–400)
RBC: 3.88 MIL/uL (ref 3.87–5.11)
RDW: 13.1 % (ref 11.5–15.5)
WBC: 10.8 10*3/uL — ABNORMAL HIGH (ref 4.0–10.5)
nRBC: 0 % (ref 0.0–0.2)

## 2022-12-24 LAB — BASIC METABOLIC PANEL
Anion gap: 11 (ref 5–15)
BUN: 50 mg/dL — ABNORMAL HIGH (ref 8–23)
CO2: 28 mmol/L (ref 22–32)
Calcium: 8.9 mg/dL (ref 8.9–10.3)
Chloride: 90 mmol/L — ABNORMAL LOW (ref 98–111)
Creatinine, Ser: 0.88 mg/dL (ref 0.44–1.00)
GFR, Estimated: >60 mL/min (ref >60)
Glucose, Bld: 138 mg/dL — ABNORMAL HIGH (ref 70–99)
Potassium: 4 mmol/L (ref 3.5–5.1)
Sodium: 129 mmol/L — ABNORMAL LOW (ref 135–145)

## 2022-12-24 MED ORDER — AMLODIPINE BESYLATE 10 MG PO TABS: 10.0000 mg | ORAL_TABLET | Freq: Every day | ORAL | Status: DC

## 2022-12-24 MED ORDER — PREDNISONE 10 MG PO TABS: 10.0000 mg | ORAL_TABLET | Freq: Every day | ORAL | 0 refills | Status: AC

## 2022-12-24 MED ORDER — UREA 15 G PO PACK: 15.0000 g | PACK | Freq: Two times a day (BID) | ORAL | Status: DC

## 2022-12-24 MED ORDER — FUROSEMIDE 20 MG PO TABS: 40.0000 mg | ORAL_TABLET | Freq: Every day | ORAL | 3 refills | Status: DC

## 2022-12-24 MED ORDER — MOMETASONE FURO-FORMOTEROL FUM 200-5 MCG/ACT IN AERO: 2.0000 | INHALATION_SPRAY | Freq: Two times a day (BID) | RESPIRATORY_TRACT | Status: DC

## 2022-12-24 MED ORDER — BENZONATATE 200 MG PO CAPS: 200.0000 mg | ORAL_CAPSULE | Freq: Three times a day (TID) | ORAL | 0 refills | Status: DC | PRN

## 2022-12-24 NOTE — Progress Notes (Signed)
Physical Therapy Treatment Patient Details Name: Karina Chase MRN: [id number] DOB: 1924/11/14 Today's Date: 12/24/2022   History of Present Illness Pt is 89 yo female presenting to [facility] with reports of shortness of breath. PMH: A-fib, HTN, chronic diastolic CHF, CKD III, Asthma.    PT Comments    Pt continues to work towards goals. Pt was CGA for sit to stand, gait and bed mobility this session with RW. She continues below baseline level of functioning and will benefit from skilled physical therapy services 3x/weekly on discharge from acute care hospital setting in order to work on endurance, strength, balance and functional mobility to decrease risk for falls and decrease burden of care on caregivers. Pt was coughing during session so deferred further gait due to difficulty breathing with a mask on  and currently on droplet precautions.    Recommendations for follow up therapy are one component of a multi-disciplinary discharge planning process, led by the attending physician.  Recommendations may be updated based on patient status, additional functional criteria and insurance authorization.  Follow Up Recommendations  Can patient physically be transported by private vehicle: Yes    Assistance Recommended at Discharge Intermittent Supervision/Assistance  Patient can return home with the following A little help with walking and/or transfers;Assist for transportation;Assistance with cooking/housework;Help with stairs or ramp for entrance   Equipment Recommendations  None recommended by PT    Recommendations for Other Services       Precautions / Restrictions Precautions Precautions: Fall Restrictions Weight Bearing Restrictions: No     Mobility  Bed Mobility Overal bed mobility: Modified Independent             General bed mobility comments: slight increase in time no physical assistance required. Patient Response: Cooperative  Transfers Overall transfer level: Needs  assistance Equipment used: Rolling walker (2 wheels) Transfers: Sit to/from Stand Sit to Stand: Min guard           General transfer comment: CGA with verbal cues for correct hand placement to prevent pulling up fro RW.  Cues for technique, requires several times to rock forward and rise from EOB using back of knees to brace.    Ambulation/Gait Ambulation/Gait assistance: Min guard Gait Distance (Feet): 30 Feet Assistive device: Rolling walker (2 wheels) Gait Pattern/deviations: Step-through pattern, Decreased stride length Gait velocity: decreased Gait velocity interpretation: <1.8 ft/sec, indicate of risk for recurrent falls   General Gait Details: Min guard for safety, ambulating with RW shows fair balance without overt instability, slower gait, shortened step length bil.   Stairs         General stair comments: Deferred stairs and further gait today due to fatigue and coughing.       Balance Overall balance assessment: Mild deficits observed, not formally tested        Cognition Arousal/Alertness: Awake/alert Behavior During Therapy: WFL for tasks assessed/performed Overall Cognitive Status: Within Functional Limits for tasks assessed                 Pertinent Vitals/Pain Pain Assessment Pain Assessment: No/denies pain     PT Goals (current goals can now be found in the care plan section) Acute Rehab PT Goals Patient Stated Goal: Pt would like to go to rehab PT Goal Formulation: With patient/family Time For Goal Achievement: 01/04/23 Potential to Achieve Goals: Fair Progress towards PT goals: Progressing toward goals    Frequency    Min 3X/week      PT Plan Current plan remains appropriate  AM-PAC PT "6 Clicks" Mobility   Outcome Measure  Help needed turning from your back to your side while in a flat bed without using bedrails?: None Help needed moving from lying on your back to sitting on the side of a flat bed without using  bedrails?: None Help needed moving to and from a bed to a chair (including a wheelchair)?: A Little Help needed standing up from a chair using your arms (e.g., wheelchair or bedside chair)?: A Little Help needed to walk in hospital room?: A Little Help needed climbing 3-5 steps with a railing? : A Little 6 Click Score: 20    End of Session   Activity Tolerance: Patient tolerated treatment well Patient left: in bed;with call bell/phone within reach;with family/visitor present Nurse Communication: Mobility status PT Visit Diagnosis: Unsteadiness on feet (R26.81);Other abnormalities of gait and mobility (R26.89)     Time: 5409-8119 PT Time Calculation (min) (ACUTE ONLY): 17 min  Charges:  $Therapeutic Activity: 8-22 mins                     Harrel Carina, DPT, CLT  Acute Rehabilitation Services Office: [phone] (Secure chat preferred)    Karina Chase 12/24/2022, 10:45 AM

## 2022-12-24 NOTE — Progress Notes (Addendum)
Rounding Note    Patient Name: Karina Chase Date of Encounter: 12/24/2022  [facility] Cardiologist: Norman Herrlich, MD   Subjective   Feeling much better this morning. Possible DC today  Inpatient Medications    Scheduled Meds:  amLODipine  10 mg Oral Daily   apixaban  2.5 mg Oral BID   docusate sodium  100 mg Oral BID   mometasone-formoterol  2 puff Inhalation BID   predniSONE  20 mg Oral Q breakfast   sodium chloride flush  3 mL Intravenous Q12H   urea  15 g Oral BID   Continuous Infusions:  sodium chloride     PRN Meds: sodium chloride, acetaminophen, albuterol, benzonatate, bisacodyl, guaiFENesin, nitroGLYCERIN, ondansetron (ZOFRAN) IV, sodium chloride flush   Vital Signs    Vitals:   12/24/22 0441 12/24/22 0444 12/24/22 0820 12/24/22 0836  BP: (!) 155/75   (!) 155/51  Pulse: 64  64 66  Resp: Temp: 97.6 F (36.4 C)   98.3 F (36.8 C)  TempSrc: Oral   Oral  SpO2: 94%   96%  Weight:  66.9 kg      Intake/Output Summary (Last 24 hours) at 12/24/2022 0944 Last data filed at 12/23/2022 1924 Gross per 24 hour  Intake 520 ml  Output --  Net 520 ml      12/24/2022    4:44 AM 12/23/2022    4:41 AM 12/22/2022    4:31 AM  Last 3 Weights  Weight (lbs) 147 lb 7.8 oz 144 lb 6.4 oz 147 lb 14.9 oz  Weight (kg) 66.9 kg 65.5 kg 67.1 kg      Telemetry    Rates now elevated in the 90-120s range at times on telemetry this morning - Personally Reviewed  ECG    No new tracing this morning  Physical Exam   GEN: No acute distress.   Neck: No JVD Cardiac: Irreg Irreg, soft systolic murmur, no rubs, or gallops.  Respiratory: Clear to auscultation bilaterally. GI: Soft, nontender, non-distended  MS: No edema; No deformity. Neuro:  Nonfocal  Psych: Normal affect   Labs    High Sensitivity Troponin:   Recent Labs  Lab 12/18/22 1958 12/18/22 2220 12/19/22 1107 12/19/22 1427  TROPONINIHS 36* 37* 31* 32*     Chemistry Recent Labs   Lab 12/18/22 1934 12/19/22 1107 12/19/22 1427 12/20/22 0444 12/20/22 1229 12/22/22 0436 12/22/22 0737 12/23/22 0027 12/23/22 0532 12/23/22 1149 12/24/22 0122  NA 120* 122*   < > 118*   < > 125*   < > 125* 132* 129* 129*  K 4.3 3.9  --  4.2   < > 4.3  --   --  3.7  --  4.0  CL 84* 82*  --  84*   < > 86*  --   --  89*  --  90*  CO2 25 27  --  27   < > 33*  --   --  33*  --  28  GLUCOSE 114* 177*  --  163*   < > 120*  --   --  112*  --  138*  BUN 27* 32*  --  33*   < > 50*  --   --  40*  --  50*  CREATININE 1.32* 1.21*  --  1.05*   < > 1.03*  --   --  0.95  --  0.88  CALCIUM 8.3* 8.6*  --  8.1*   < >  8.8*  --   --  8.7*  --  8.9  MG  --  1.7  --  2.4  --   --   --  2.4  --   --   --   PROT 7.0  --   --   --   --   --   --   --   --   --   --   ALBUMIN 3.3*  --   --   --   --   --   --   --   --   --   --   AST 46*  --   --   --   --   --   --   --   --   --   --   ALT 35  --   --   --   --   --   --   --   --   --   --   ALKPHOS 78  --   --   --   --   --   --   --   --   --   --   BILITOT 0.7  --   --   --   --   --   --   --   --   --   --   GFRNONAA 37* 41*  --  49*   < > 50*  --   --  55*  --  >60  ANIONGAP 11 13  --  7   < > 6  --   --  10  --  11   < > = values in this interval not displayed.    Lipids No results for input(s): "CHOL", "TRIG", "HDL", "LABVLDL", "LDLCALC", "CHOLHDL" in the last 168 hours.  Hematology Recent Labs  Lab 12/22/22 0436 12/23/22 0532 12/24/22 0122  WBC 8.0 8.2 10.8*  RBC 3.87 3.60* 3.88  HGB 11.9* 11.2* 11.9*  HCT 34.2* 32.1* 35.3*  MCV 88.4 89.2 91.0  MCH 30.7 31.1 30.7  MCHC 34.8 34.9 33.7  RDW 12.9 12.9 13.1  PLT 185 198 247   Thyroid  Recent Labs  Lab 12/21/22 1203  TSH 2.845    BNP Recent Labs  Lab 12/18/22 1958  BNP 599.1*    DDimer No results for input(s): "DDIMER" in the last 168 hours.   Radiology    No results found.  Cardiac Studies   Echo: 12/19/2022  IMPRESSIONS     1. Left ventricular ejection  fraction, by estimation, is 60 to 65%. The  left ventricle has normal function. The left ventricle has no regional  wall motion abnormalities. There is mild left ventricular hypertrophy.  Left ventricular diastolic function  could not be evaluated.   2. Right ventricular systolic function is mildly reduced. The right  ventricular size is normal. There is moderately elevated pulmonary artery  systolic pressure. The estimated right ventricular systolic pressure is  53.4 mmHg.   3. Left atrial size was mildly dilated.   4. Right atrial size was moderately dilated.   5. The mitral valve is normal in structure. Mild mitral valve  regurgitation. No evidence of mitral stenosis.   6. The tricuspid valve is abnormal. Tricuspid valve regurgitation is  moderate.   7. The aortic valve is tricuspid. Aortic valve regurgitation is not  visualized. No aortic stenosis is present.   8. The inferior vena cava is normal in size  with <50% respiratory  variability, suggesting right atrial pressure of 8 mmHg.   Comparison(s): No significant change from prior study.   FINDINGS   Left Ventricle: Left ventricular ejection fraction, by estimation, is 60  to 65%. The left ventricle has normal function. The left ventricle has no  regional wall motion abnormalities. The left ventricular internal cavity  size was normal in size. There is   mild left ventricular hypertrophy. Left ventricular diastolic function  could not be evaluated due to atrial fibrillation. Left ventricular  diastolic function could not be evaluated.   Right Ventricle: The right ventricular size is normal. No increase in  right ventricular wall thickness. Right ventricular systolic function is  mildly reduced. There is moderately elevated pulmonary artery systolic  pressure. The tricuspid regurgitant  velocity is 3.37 m/s, and with an assumed right atrial pressure of 8 mmHg,  the estimated right ventricular systolic pressure is 53.4 mmHg.    Left Atrium: Left atrial size was mildly dilated.   Right Atrium: Right atrial size was moderately dilated.   Pericardium: There is no evidence of pericardial effusion.   Mitral Valve: The mitral valve is normal in structure. Mild mitral valve  regurgitation. No evidence of mitral valve stenosis.   Tricuspid Valve: The tricuspid valve is abnormal. Tricuspid valve  regurgitation is moderate . No evidence of tricuspid stenosis.   Aortic Valve: The aortic valve is tricuspid. Aortic valve regurgitation is  not visualized. No aortic stenosis is present.   Pulmonic Valve: The pulmonic valve was normal in structure. Pulmonic valve  regurgitation is not visualized. No evidence of pulmonic stenosis.   Aorta: The aortic root and ascending aorta are structurally normal, with  no evidence of dilitation.   Venous: The inferior vena cava is normal in size with less than 50%  respiratory variability, suggesting right atrial pressure of 8 mmHg.   IAS/Shunts: No atrial level shunt detected by color flow Doppler.       Patient Profile     89 y.o. female with a hx of persistent atrial fibrillation, moderate pulmonary HTN with mild MR/moderate TR by prior echo, arthritis, asthma, breast CA, CKD stage 3b, chronic diastolic CHF who is being seen 12/23/2022 for the evaluation of pauses at the request of Dr. Dayna Barker.   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 Tachy/brady syndrome? I did discuss this with the patient and family. They confirm they would not be interested in any invasive procedures or surgery. She remains asymptomatic. -- TSH wnl -- continue on Eliquis 2.5mg  BID   HTN -- blood pressures remain elevated, though question whether steroid use is contributing? -- continue amlodipine 10mg  daily    Coronary/aortic atherosclerosis -- no anginal symptoms, EF normal -- no ASA due  to concomitant Eliquis  Will send message to the [place] office for follow up.  For questions or updates, please contact [facility] Please consult [email or web address] for contact info under        Signed, Laverda Page, NP  12/24/2022, 9:44 AM     ATTENDING ATTESTATION:  After conducting a review of all available clinical information with the care team, interviewing the patient, and performing a physical exam, I agree with the findings and plan described in this note.   GEN: No acute distress.   HEENT:  MMM, no JVD, no scleral icterus Cardiac: irregular RR, with soft murmur Respiratory: Clear to auscultation bilaterally. GI: Soft, nontender, non-distended  MS: No edema; No deformity. Neuro:  Nonfocal  Vasc:  +2 radial pulses  The patient is feeling somewhat better.  She is developed no recurrent pauses on the monitor.  Her biggest complaint continues to be constipation.  Continue medical therapy.  Will sign off and arrange cardiology follow-up.  Alverda Skeans, MD Pager [phone]

## 2022-12-24 NOTE — Progress Notes (Signed)
[facility] Progress Note    Assessment/ Plan:   Hyponatremia, severe. 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 Possibly related to respiratory issues? Euvolemic on exam. TSH WNL. Repeat osm/urine studies on 4/15 shows low serum osm, low urine osm. Likely related to low solute intake as well. Will hold on lasix -fluid restrict 1.2L/day -monitor Na daily -c/w urea 15g bid for now, hold lasix for about 3 days and can resume  daily -will need labs done at [facility]. Im hoping that as her resp status improves and her solute intake improves her Na will continue to stabilize   AKI on CKD 3 -baseline Cr around 1.1 -Cr back to baseline -daily labs, strict I/O   Acute on chronic diastolic CHF exacerbation -repeat CXR 4/15 better. Seems euvolemic on exam, lasix held as above, can resume on d/c   Acute asthma exacerbation -receiving prednisone -per primary service   Afib -on eliquis, rate controlled, not receiving any rate controlling meds   HTN -BP slightly higher than expected, possibly related to steroids? BP remains uncontrolled, will increase her amlodipine to  daily   Anemia -mild -per primary service  Discussed with primary service. Okay for d/c from a nephro perspective.  Anthony Sar, MD [facility]  Subjective:   No acute events. Being discharged to SNF   Objective:   BP (!) 155/51 (BP Location: Right Arm)   Pulse 66   Temp 98.3 F (36.8 C) (Oral)   Resp 18   Wt 66.9 kg   SpO2 96%   BMI 26.13 kg/m   Intake/Output Summary (Last 24 hours) at 12/24/2022 1223 Last data filed at 12/23/2022 1924 Gross per 24 hour  Intake 400 ml  Output --  Net 400 ml   Weight change: 1.4 kg  Physical Exam: Gen: NAD, laying flat in bed CVS: irreg irreg Resp: normal WOB Abd: soft Ext: no edema Neuro: awake, alert, hard of hearing  Imaging: No results found.  Labs: BMET Recent Labs  Lab 12/19/22 1107 12/19/22 1427  12/20/22 0444 12/20/22 1229 12/21/22 0054 12/21/22 1203 12/22/22 0436 12/22/22 0737 12/22/22 1154 12/22/22 1739 12/23/22 0027 12/23/22 0532 12/23/22 1149 12/24/22 0122  NA 122*   < > 118* 121* 118*   < > 125* 127* 125* 126* 125* 132* 129* 129*  K 3.9  --  4.2 4.5 4.6  --  4.3  --   --   --   --  3.7  --  4.0  CL 82*  --  84* 83* 81*  --  86*  --   --   --   --  89*  --  90*  CO2 27  --  --  33*  --   --   --   --  33*  --  28  GLUCOSE 177*  --  163* 142* 133*  --  120*  --   --   --   --  112*  --  138*  BUN 32*  --  33* 33* 35*  --  50*  --   --   --   --  40*  --  50*  CREATININE 1.21*  --  1.05* 1.06* 1.09*  --  1.03*  --   --   --   --  0.95  --  0.88  CALCIUM 8.6*  --  8.1* 8.7* 8.6*  --  8.8*  --   --   --   --  8.7*  --  8.9   < > = values in this interval not displayed.   CBC Recent Labs  Lab 12/18/22 1934 12/19/22 1107 12/21/22 0054 12/22/22 0436 12/23/22 0532 12/24/22 0122  WBC 4.5   < > 7.6 8.0 8.2 10.8*  NEUTROABS 3.4  --   --   --   --   --   HGB 10.0*   < > 10.4* 11.9* 11.2* 11.9*  HCT 30.2*   < > 29.5* 34.2* 32.1* 35.3*  MCV 93.5   < > 88.6 88.4 89.2 91.0  PLT 141*   < > 164 185 198 247   < > = values in this interval not displayed.    Medications:     amLODipine  10 mg Oral Daily   apixaban  2.5 mg Oral BID   docusate sodium  100 mg Oral BID   mometasone-formoterol  2 puff Inhalation BID   sodium chloride flush  3 mL Intravenous Q12H   urea  15 g Oral BID      Anthony Sar, MD [facility] 12/24/2022, 12:23 PM

## 2022-12-24 NOTE — TOC Transition Note (Signed)
Transition of Care [facility]) - CM/SW Discharge Note   Patient Details  Name: Karina Chase MRN: [id number] Date of Birth: 03/04/1925  [facility]) CM/SW Contact:  Carley Hammed, LCSWA Phone Number: 12/24/2022, 11:17 AM   Clinical Narrative:    Pt to be transported to [facility] via [facility]. Nurse to call report to [phone]. Rm# [phone]   Final next level of care: Skilled Nursing Facility Barriers to Discharge: Barriers Resolved   Patient Goals and CMS Choice      Discharge Placement                Patient chooses bed at: [facility] [place] Patient to be transferred to facility by: [facility] Name of family member notified: Dianne Patient and family notified of of transfer: 12/24/22  Discharge Plan and Services Additional resources added to the After Visit Summary for                                       Social Determinants of Health (SDOH) Interventions SDOH Screenings   Tobacco Use: Low Risk  (12/23/2022)     Readmission Risk Interventions     No data to display

## 2022-12-24 NOTE — Discharge Summary (Signed)
Physician Discharge Summary  Karina Chase:[id number] DOB: Aug 13, 1925 DOA: 12/18/2022  PCP: Baldo Daub, MD  Admit date: 12/18/2022 Discharge date: 12/24/2022 Recommendations for Outpatient Follow-up:  Follow up with PCP in 1 weeks-call for appointment Please obtain BMP/CBC in one week to monitor sodium level  Discharge Dispo: SNF Discharge Condition: Stable Code Status:   Code Status: DNR Diet recommendation:  Diet Order             DIET DYS 3 Room service appropriate? Yes with Assist; Fluid consistency: Thin; Fluid restriction: 1200 mL Fluid  Diet effective now                   Brief/Interim Summary: 89 year old female with past medical history of persistent A-fib, on chronic anticoagulation, hypertensive heart disease with chronic diastolic CHF, CKD stage III, asthma present here with complaining of shortness of breath.  Her cardiologist recently increased Lasix to twice daily back in March.  She presented with cough, shortness of breath since 1 week.  She was given doxycycline and prednisone by her PCP however symptoms progressively worse therefore she came to the ER for further eval and management. Her imaging showed: Ground-glass opacities in the posterior right lobe which could be due to edema or infection/inflammation.  Bronchial wall thickening and mucous plugging in lower lobes.  Cardiomegaly and dilated IVC.  Enlargement pulmonary artery seen and pulmonary hypertension.  Patient was given a dose of Lasix and admitted for further eval and management of CHF exacerbation. 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  Per nephrology can resume URE-NA 15 g bid 1000 at SNF with intermittent monitoring of labs discussed w/ nephro and okay for d/c home Recent Labs  Lab 12/22/22 1739 12/23/22 0027 12/23/22 0532 12/23/22 1149 12/24/22 0122  NA 126* 125* 132* 129* 129*   Persistent A-fib on Eliquis. Slow ventricular response Pauses 2.1, 2.2-second: Having asymptomatic pauses, seen by cardiology input appreciated continue to monitor electrolytes, avoid beta-blocker, continue her Eliquis    Acute on chronic diastolic WJX:BJYNWGNFA with worsening cough and shortness of breath.  BNP elevated at 599.CT chest done in ED-showing groundglass opacity seen in the posterior right lower lobe.being managed with IV Lasix, clinically much better.> less has been discontinued along with URE- NA-input appreciated from nephrology.  Can resume oral Lasix on discharge per nephrology.TTE with preserved EF unable to estimate diastolic function similar to prior total net negative and weight improved as below Net IO Since Admission: -1,750 mL [12/24/22 0902]  Filed Weights   12/22/22 0431 12/23/22 0441 12/24/22 0444  Weight: 67.1 kg 65.5 kg 66.9 kg   Acute asthma exacerbation Parainfluenza +: Tested negative for COVID flu and RSV.  Breathing status much better wheezing improving continue on low-dose prednisone 20 mg> will continue for few more days and stop.  Continue short term Dulera, bronchodilators.Doxy was  discontinued since procalcitonin was negative.    Elevated troponin: Patient denies ACS symptoms.  Likely demand ischemia.  Troponin was slightly elevated but flat   Hypertensive heart disease: BP remains controlled on 10 mg amlodipine.   AKI on CKD stage IIIa: Baseline creatinine 1.1  peaked to 1.3> now improved 0.8  Recent Labs    12/18/22 1843 12/18/22 1934 12/19/22 1107 12/20/22 0444 12/20/22 1229 12/21/22 0054 12/22/22 0436 12/23/22 0532 12/24/22 0122  BUN 30* 27* 32* 33* 33* 35* 50* 40* 50*  CREATININE 1.30* 1.32* 1.21* 1.05* 1.06* 1.09* 1.03* 0.95 0.88     Normocytic anemia: hb is stable , cont to monitor Thrombocytopenia: Resolved.   lactic acid is 2.4 on 4/13 resolved  Deconditioning/debility weakness improving appreciate PT OT input advised home health PT OT, daughter wondering about short-term rehab versus ALF since patient lives alone and family unable to provide 24/7 care TOC following.  She has a skilled nursing facility approved today  Goals of care CODE STATUS DNR, patient has been living independently, given advanced age, monitor closely at risk of decompensation and readmission. Discussed plan of care with the patient and the daughter and plan is to discharge to skilled nursing facility today   Consults: Nephrology  Subjective: Alert awake Resting well Feels well   Discharge Exam: Vitals:   12/24/22 0820 12/24/22 0836  BP:  (!) 155/51  Pulse: 64 66  Resp: 16 18  Temp:  98.3 F (36.8 C)  SpO2:  96%   General: Pt is alert, awake, not in acute distress Cardiovascular: RRR, S1/S2 +, no rubs, no gallops Respiratory: CTA bilaterally, no wheezing, no rhonchi Abdominal: Soft, NT, ND, bowel sounds + Extremities: no edema, no cyanosis  Discharge Instructions  Discharge Instructions     Discharge instructions   Complete by: As directed    Check BMP within a week to monitor sodium level  Continue bronchodilators and her while she is having  cough  Please call call MD or return to ER for similar or worsening recurring problem that brought you to hospital or if any fever,nausea/vomiting,abdominal pain, uncontrolled pain, chest pain,  shortness of breath or any other alarming symptoms.  Please follow-up your doctor as instructed in a week time and call the office for appointment.  Please avoid alcohol, smoking, or any other illicit substance and maintain healthy habits including taking your regular medications as prescribed.  You were cared for by a hospitalist during your hospital stay. If you have any questions about your discharge medications or the care you received while you were in the hospital after you are discharged, you can call the unit and ask to speak with the hospitalist on call if the hospitalist that took care of you is not available.  Once you are discharged, your primary care physician will handle any further medical issues. Please note that NO REFILLS for any discharge medications will be authorized once you are discharged, as it is imperative that you return to your primary care physician (or establish a relationship with a primary care physician if you do not have one) for your aftercare needs so that they can reassess your need for medications and monitor your lab values   Increase activity slowly   Complete by: As directed       Allergies as of 12/24/2022  Reactions   Levofloxacin Nausea And Vomiting   Keflex [cephalexin]    unknown   Prilosec [omeprazole]    unknown   Sulfa Antibiotics    unknown   Sulfamethoxazole-trimethoprim    Other reaction(s): rash   Azithromycin Rash   Penicillin G Rash   Penicillins Rash        Medication List     STOP taking these medications    predniSONE 10 MG (21) Tbpk tablet Commonly known as: STERAPRED UNI-PAK 21 TAB Replaced by: predniSONE 10 MG tablet       TAKE these medications    albuterol 108 (90 Base) MCG/ACT inhaler Commonly known as:  VENTOLIN HFA Inhale 1-2 puffs into the lungs every 6 (six) hours as needed for wheezing or shortness of breath.   amLODipine 10 MG tablet Commonly known as: NORVASC Take 1 tablet (10 mg total) by mouth daily. Start taking on: December 25, 2022 What changed:  medication strength how much to take   benzonatate 200 MG capsule Commonly known as: TESSALON Take 1 capsule (200 mg total) by mouth 3 (three) times daily as needed for cough.   Eliquis 2.5 MG Tabs tablet Generic drug: apixaban TAKE ONE TABLET BY MOUTH TWICE DAILY What changed: how much to take   furosemide 20 MG tablet Commonly known as: LASIX Take 2 tablets (40 mg total) by mouth daily. What changed:  how much to take when to take this   guaiFENesin 600 MG 12 hr tablet Commonly known as: MUCINEX Take 600 mg by mouth 2 (two) times daily as needed for to loosen phlegm.   mometasone-formoterol 200-5 MCG/ACT Aero Commonly known as: DULERA Inhale 2 puffs into the lungs 2 (two) times daily for 14 days.   nitroGLYCERIN 0.4 MG SL tablet Commonly known as: NITROSTAT Place 1 tablet (0.4 mg total) under the tongue every 5 (five) minutes as needed for chest pain.   ondansetron 4 MG disintegrating tablet Commonly known as: ZOFRAN-ODT Take 4 mg by mouth every 8 (eight) hours.   predniSONE 10 MG tablet Commonly known as: DELTASONE Take 1 tablet (10 mg total) by mouth daily for 3 days. Replaces: predniSONE 10 MG (21) Tbpk tablet   urea 15 g Pack oral packet Commonly known as: URE-NA Take 15 g by mouth 2 (two) times daily.        Follow-up Information     Baldo Daub, MD Follow up in 1 week(s).   Specialties: Cardiology, Radiology Contact information: [address] [place] [address] [phone]         Anthony Sar, MD Follow up in 1 week(s).   Specialty: Nephrology Contact information: [address] [place] [address] [phone]                Allergies  Allergen Reactions   Levofloxacin  Nausea And Vomiting   Keflex [Cephalexin]     unknown   Prilosec [Omeprazole]     unknown   Sulfa Antibiotics     unknown   Sulfamethoxazole-Trimethoprim     Other reaction(s): rash   Azithromycin Rash   Penicillin G Rash   Penicillins Rash    The results of significant diagnostics from this hospitalization (including imaging, microbiology, ancillary and laboratory) are listed below for reference.    Microbiology: Recent Results (from the past 240 hour(s))  Resp panel by RT-PCR (RSV, Flu A&B, Covid) Anterior Nasal Swab     Status: None   Collection Time: 12/18/22  8:06 PM   Specimen: Anterior  Nasal Swab  Result Value Ref Range Status   SARS Coronavirus 2 by RT PCR NEGATIVE NEGATIVE Final   Influenza A by PCR NEGATIVE NEGATIVE Final   Influenza B by PCR NEGATIVE NEGATIVE Final    Comment: (NOTE) The Xpert Xpress SARS-CoV-2/FLU/RSV plus assay is intended as an aid in the diagnosis of influenza from Nasopharyngeal swab specimens and should not be used as a sole basis for treatment. Nasal washings and aspirates are unacceptable for Xpert Xpress SARS-CoV-2/FLU/RSV testing.  Fact Sheet for Patients: BloggerCourse.com  Fact Sheet for Healthcare Providers: SeriousBroker.it  This test is not yet approved or cleared by the [place] FDA and has been authorized for detection and/or diagnosis of SARS-CoV-2 by FDA under an Emergency Use Authorization (EUA). This EUA will remain in effect (meaning this test can be used) for the duration of the COVID-19 declaration under Section 564(b)(1) of the Act, 21 U.S.C. section 360bbb-3(b)(1), unless the authorization is terminated or revoked.     Resp Syncytial Virus by PCR NEGATIVE NEGATIVE Final    Comment: (NOTE) Fact Sheet for Patients: BloggerCourse.com  Fact Sheet for Healthcare Providers: SeriousBroker.it  This test is not yet  approved or cleared by the [place] FDA and has been authorized for detection and/or diagnosis of SARS-CoV-2 by FDA under an Emergency Use Authorization (EUA). This EUA will remain in effect (meaning this test can be used) for the duration of the COVID-19 declaration under Section 564(b)(1) of the Act, 21 U.S.C. section 360bbb-3(b)(1), unless the authorization is terminated or revoked.  Performed at [facility], [address] [place], [place] [address]   Respiratory (~20 pathogens) panel by PCR     Status: Abnormal   Collection Time: 12/20/22  3:11 PM   Specimen: Nasopharyngeal Swab; Respiratory  Result Value Ref Range Status   Adenovirus NOT DETECTED NOT DETECTED Final   Coronavirus 229E NOT DETECTED NOT DETECTED Final    Comment: (NOTE) The Coronavirus on the Respiratory Panel, DOES NOT test for the novel  Coronavirus (2019 nCoV)    Coronavirus HKU1 NOT DETECTED NOT DETECTED Final   Coronavirus NL63 NOT DETECTED NOT DETECTED Final   Coronavirus OC43 NOT DETECTED NOT DETECTED Final   Metapneumovirus NOT DETECTED NOT DETECTED Final   Rhinovirus / Enterovirus NOT DETECTED NOT DETECTED Final   Influenza A NOT DETECTED NOT DETECTED Final   Influenza B NOT DETECTED NOT DETECTED Final   Parainfluenza Virus 1 NOT DETECTED NOT DETECTED Final   Parainfluenza Virus 2 NOT DETECTED NOT DETECTED Final   Parainfluenza Virus 3 DETECTED (A) NOT DETECTED Final   Parainfluenza Virus 4 NOT DETECTED NOT DETECTED Final   Respiratory Syncytial Virus NOT DETECTED NOT DETECTED Final   Bordetella pertussis NOT DETECTED NOT DETECTED Final   Bordetella Parapertussis NOT DETECTED NOT DETECTED Final   Chlamydophila pneumoniae NOT DETECTED NOT DETECTED Final   Mycoplasma pneumoniae NOT DETECTED NOT DETECTED Final    Comment: Performed at [facility], [address] [place], [place] [address]    Procedures/Studies: DG Chest Port 1 View  Result Date: 12/21/2022 CLINICAL DATA:  [id number]  Congestive heart failure (CHF) [id number] EXAM: PORTABLE CHEST 1 VIEW COMPARISON:  Radiograph 12/18/2022 FINDINGS: Unchanged cardiomegaly. Bibasilar atelectasis. Trace right pleural effusion. Left peripheral basilar pleural thickening, unchanged. Increased right basilar airspace disease. No pneumothorax. Bones are unchanged. IMPRESSION: Increased right basilar airspace opacities, could be atelectasis or developing infection. Electronically Signed   By: Caprice Renshaw M.D.   On: 12/21/2022 09:55  ECHOCARDIOGRAM COMPLETE  Result Date: 12/19/2022    ECHOCARDIOGRAM REPORT   Patient Name:   Karina Chase Date of Exam: 12/19/2022 Medical Rec #:  [id number]        Height:       63.0 in Accession #:    [id number]       Weight:       154.3 lb Date of Birth:  1925-08-08        BSA:          1.732 m Patient Age:    89 years         BP:           155/77 mmHg Patient Gender: F                HR:           81 bpm. Exam Location:  Inpatient Procedure: 2D Echo, Cardiac Doppler and Color Doppler Indications:    A flutter  History:        Patient has prior history of Echocardiogram examinations, most                 recent 06/20/2020. CHF, COPD, Signs/Symptoms:Chest Pain; Risk                 Factors:Hypertension.  Sonographer:    Melissa Morford RDCS (AE, PE) Referring Phys: [id number] JARED M GARDNER IMPRESSIONS  1. Left ventricular ejection fraction, by estimation, is 60 to 65%. The left ventricle has normal function. The left ventricle has no regional wall motion abnormalities. There is mild left ventricular hypertrophy. Left ventricular diastolic function could not be evaluated.  2. Right ventricular systolic function is mildly reduced. The right ventricular size is normal. There is moderately elevated pulmonary artery systolic pressure. The estimated right ventricular systolic pressure is 53.4 mmHg.  3. Left atrial size was mildly dilated.  4. Right atrial size was moderately dilated.  5. The mitral valve is normal in structure. Mild  mitral valve regurgitation. No evidence of mitral stenosis.  6. The tricuspid valve is abnormal. Tricuspid valve regurgitation is moderate.  7. The aortic valve is tricuspid. Aortic valve regurgitation is not visualized. No aortic stenosis is present.  8. The inferior vena cava is normal in size with <50% respiratory variability, suggesting right atrial pressure of 8 mmHg. Comparison(s): No significant change from prior study. FINDINGS  Left Ventricle: Left ventricular ejection fraction, by estimation, is 60 to 65%. The left ventricle has normal function. The left ventricle has no regional wall motion abnormalities. The left ventricular internal cavity size was normal in size. There is  mild left ventricular hypertrophy. Left ventricular diastolic function could not be evaluated due to atrial fibrillation. Left ventricular diastolic function could not be evaluated. Right Ventricle: The right ventricular size is normal. No increase in right ventricular wall thickness. Right ventricular systolic function is mildly reduced. There is moderately elevated pulmonary artery systolic pressure. The tricuspid regurgitant velocity is 3.37 m/s, and with an assumed right atrial pressure of 8 mmHg, the estimated right ventricular systolic pressure is 53.4 mmHg. Left Atrium: Left atrial size was mildly dilated. Right Atrium: Right atrial size was moderately dilated. Pericardium: There is no evidence of pericardial effusion. Mitral Valve: The mitral valve is normal in structure. Mild mitral valve regurgitation. No evidence of mitral valve stenosis. Tricuspid Valve: The tricuspid valve is abnormal. Tricuspid valve regurgitation is moderate . No evidence of tricuspid stenosis. Aortic Valve: The aortic valve is tricuspid. Aortic valve regurgitation  is not visualized. No aortic stenosis is present. Pulmonic Valve: The pulmonic valve was normal in structure. Pulmonic valve regurgitation is not visualized. No evidence of pulmonic  stenosis. Aorta: The aortic root and ascending aorta are structurally normal, with no evidence of dilitation. Venous: The inferior vena cava is normal in size with less than 50% respiratory variability, suggesting right atrial pressure of 8 mmHg. IAS/Shunts: No atrial level shunt detected by color flow Doppler.  LEFT VENTRICLE PLAX 2D LVIDd:         3.80 cm LVIDs:         2.80 cm LV PW:         1.20 cm LV IVS:        1.10 cm LVOT diam:     1.90 cm LV SV:         47 LV SV Index:   27 LVOT Area:     2.84 cm  LV Volumes (MOD) LV vol d, MOD A2C: 84.7 ml LV vol d, MOD A4C: 58.5 ml LV vol s, MOD A2C: 35.0 ml LV vol s, MOD A4C: 35.2 ml LV SV MOD A2C:     49.7 ml LV SV MOD A4C:     58.5 ml LV SV MOD BP:      34.5 ml RIGHT VENTRICLE RV S prime:     9.90 cm/s TAPSE (M-mode): 1.6 cm LEFT ATRIUM             Index        RIGHT ATRIUM           Index LA diam:        4.10 cm 2.37 cm/m   RA Area:     18.00 cm LA Vol (A2C):   81.8 ml 47.23 ml/m  RA Volume:   47.30 ml  27.31 ml/m LA Vol (A4C):   49.0 ml 28.29 ml/m LA Biplane Vol: 63.3 ml 36.55 ml/m  AORTIC VALVE LVOT Vmax:   81.30 cm/s LVOT Vmean:  64.600 cm/s LVOT VTI:    0.165 m  AORTA Ao Root diam: 3.40 cm Ao Asc diam:  3.40 cm MITRAL VALVE                TRICUSPID VALVE MV Area (PHT): 4.33 cm     TR Peak grad:   45.4 mmHg MV Decel Time: 175 msec     TR Vmax:        337.00 cm/s MV E velocity: 112.00 cm/s                             SHUNTS                             Systemic VTI:  0.16 m                             Systemic Diam: 1.90 cm Vishnu Priya Mallipeddi Electronically signed by Winfield Rast Mallipeddi Signature Date/Time: 12/19/2022/3:32:05 PM    Final    CT Chest Wo Contrast  Result Date: 12/19/2022 CLINICAL DATA:  Shortness of breath, cough, history of asthma. Pneumonia complication suspected EXAM: CT CHEST WITHOUT CONTRAST TECHNIQUE: Multidetector CT imaging of the chest was performed following the standard protocol without IV contrast. RADIATION DOSE  REDUCTION: This exam was performed according to the departmental dose-optimization program which includes automated exposure control, adjustment  of the mA and/or kV according to patient size and/or use of iterative reconstruction technique. COMPARISON:  Radiographs 12/18/2022 FINDINGS: Cardiovascular: Cardiomegaly. Coronary artery and aortic atherosclerotic calcification. No pericardial effusion. The main pulmonary artery measuring 34 mm in diameter. Mediastinum/Nodes: Mediastinal and hilar adenopathy are suboptimally assessed without IV contrast. Prominent precarinal lymph node measures 1.4 cm in short axis and is favored reactive. Unremarkable esophagus. Lungs/Pleura: Mild bronchial wall thickening and mucous plugging in the lower lungs. Interlobular septal thickening in the lung bases. Linear atelectasis/scarring in the lung bases. Ground-glass opacities in the posterior right lower lobe. Trace right pleural effusion. No pneumothorax. Upper Abdomen: Dilated IVC compatible with elevated right heart pressures. No acute abnormality in the abdomen. Musculoskeletal: Thoracic spondylosis.  No acute fracture. IMPRESSION: 1. Mild pulmonary edema with trace right pleural effusion. 2. Ground-glass opacities in the posterior right lower lobe, which could be due to edema or infection/inflammation. 3. Bronchial wall thickening and mucous plugging in the lower lobes. 4. Cardiomegaly and dilated IVC compatible with elevated right heart pressures. 5. Enlarged main pulmonary artery, which can be seen in the setting of pulmonary hypertension. 6. Coronary artery atherosclerosis Aortic Atherosclerosis (ICD10-I70.0). Electronically Signed   By: Minerva Fester M.D.   On: 12/19/2022 00:58   DG Chest 2 View  Result Date: 12/18/2022 CLINICAL DATA:  Short of breath.  Cough. EXAM: CHEST - 2 VIEW COMPARISON:  05/06/2016. FINDINGS: Mild enlargement of the cardiac silhouette. No mediastinal or hilar masses. No evidence of adenopathy.  Lungs are hyperexpanded. Streaky opacities noted in the medial lower lobes consistent with chronic bronchitic change and unchanged from the prior exam. No evidence of pneumonia or pulmonary edema. No pleural effusion or pneumothorax. Skeletal structures demineralized, grossly intact. IMPRESSION: 1. No acute cardiopulmonary disease. Electronically Signed   By: Amie Portland M.D.   On: 12/18/2022 12:48    Labs: BNP (last 3 results) Recent Labs    12/18/22 1958  BNP 599.1*   Basic Metabolic Panel: Recent Labs  Lab 12/19/22 1107 12/19/22 1427 12/20/22 0444 12/20/22 1229 12/21/22 0054 12/21/22 1203 12/22/22 0436 12/22/22 0737 12/22/22 1739 12/23/22 0027 12/23/22 0532 12/23/22 1149 12/24/22 0122  NA 122*   < > 118* 121* 118*   < > 125*   < > 126* 125* 132* 129* 129*  K 3.9  --  4.2 4.5 4.6  --  4.3  --   --   --  3.7  --  4.0  CL 82*  --  84* 83* 81*  --  86*  --   --   --  89*  --  90*  CO2 27  --  27 28 28   --  33*  --   --   --  33*  --  28  GLUCOSE 177*  --  163* 142* 133*  --  120*  --   --   --  112*  --  138*  BUN 32*  --  33* 33* 35*  --  50*  --   --   --  40*  --  50*  CREATININE 1.21*  --  1.05* 1.06* 1.09*  --  1.03*  --   --   --  0.95  --  0.88  CALCIUM 8.6*  --  8.1* 8.7* 8.6*  --  8.8*  --   --   --  8.7*  --  8.9  MG 1.7  --  2.4  --   --   --   --   --   --  2.4  --   --   --    < > = values in this interval not displayed.   Liver Function Tests: Recent Labs  Lab 12/18/22 1934  AST 46*  ALT 35  ALKPHOS 78  BILITOT 0.7  PROT 7.0  ALBUMIN 3.3*   No results for input(s): "LIPASE", "AMYLASE" in the last 168 hours. No results for input(s): "AMMONIA" in the last 168 hours. CBC: Recent Labs  Lab 12/18/22 1934 12/19/22 1107 12/20/22 0444 12/21/22 0054 12/22/22 0436 12/23/22 0532 12/24/22 0122  WBC 4.5   < > 3.5* 7.6 8.0 8.2 10.8*  NEUTROABS 3.4  --   --   --   --   --   --   HGB 10.0*   < > 9.5* 10.4* 11.9* 11.2* 11.9*  HCT 30.2*   < > 27.3* 29.5*  34.2* 32.1* 35.3*  MCV 93.5   < > 90.1 88.6 88.4 89.2 91.0  PLT 141*   < > 124* 164 185 198 247   < > = values in this interval not displayed.   Cardiac Enzymes: No results for input(s): "CKTOTAL", "CKMB", "CKMBINDEX", "TROPONINI" in the last 168 hours. BNP: Invalid input(s): "POCBNP" CBG: No results for input(s): "GLUCAP" in the last 168 hours. D-Dimer No results for input(s): "DDIMER" in the last 72 hours. Hgb A1c No results for input(s): "HGBA1C" in the last 72 hours. Lipid Profile No results for input(s): "CHOL", "HDL", "LDLCALC", "TRIG", "CHOLHDL", "LDLDIRECT" in the last 72 hours. Thyroid function studies Recent Labs    12/21/22 1203  TSH 2.845   Anemia work up No results for input(s): "VITAMINB12", "FOLATE", "FERRITIN", "TIBC", "IRON", "RETICCTPCT" in the last 72 hours. Urinalysis No results found for: "COLORURINE", "APPEARANCEUR", "LABSPEC", "PHURINE", "GLUCOSEU", "HGBUR", "BILIRUBINUR", "KETONESUR", "PROTEINUR", "UROBILINOGEN", "NITRITE", "LEUKOCYTESUR" Sepsis Labs Recent Labs  Lab 12/21/22 0054 12/22/22 0436 12/23/22 0532 12/24/22 0122  WBC 7.6 8.0 8.2 10.8*   Microbiology Recent Results (from the past 240 hour(s))  Resp panel by RT-PCR (RSV, Flu A&B, Covid) Anterior Nasal Swab     Status: None   Collection Time: 12/18/22  8:06 PM   Specimen: Anterior Nasal Swab  Result Value Ref Range Status   SARS Coronavirus 2 by RT PCR NEGATIVE NEGATIVE Final   Influenza A by PCR NEGATIVE NEGATIVE Final   Influenza B by PCR NEGATIVE NEGATIVE Final    Comment: (NOTE) The Xpert Xpress SARS-CoV-2/FLU/RSV plus assay is intended as an aid in the diagnosis of influenza from Nasopharyngeal swab specimens and should not be used as a sole basis for treatment. Nasal washings and aspirates are unacceptable for Xpert Xpress SARS-CoV-2/FLU/RSV testing.  Fact Sheet for Patients: BloggerCourse.com  Fact Sheet for Healthcare  Providers: SeriousBroker.it  This test is not yet approved or cleared by the [place] FDA and has been authorized for detection and/or diagnosis of SARS-CoV-2 by FDA under an Emergency Use Authorization (EUA). This EUA will remain in effect (meaning this test can be used) for the duration of the COVID-19 declaration under Section 564(b)(1) of the Act, 21 U.S.C. section 360bbb-3(b)(1), unless the authorization is terminated or revoked.     Resp Syncytial Virus by PCR NEGATIVE NEGATIVE Final    Comment: (NOTE) Fact Sheet for Patients: BloggerCourse.com  Fact Sheet for Healthcare Providers: SeriousBroker.it  This test is not yet approved or cleared by the [place] FDA and has been authorized for detection and/or diagnosis of SARS-CoV-2 by FDA under an Emergency Use Authorization (EUA). This EUA will remain in effect (  meaning this test can be used) for the duration of the COVID-19 declaration under Section 564(b)(1) of the Act, 21 U.S.C. section 360bbb-3(b)(1), unless the authorization is terminated or revoked.  Performed at [facility], [address], [place], [place] [address]   Respiratory (~20 pathogens) panel by PCR     Status: Abnormal   Collection Time: 12/20/22  3:11 PM   Specimen: Nasopharyngeal Swab; Respiratory  Result Value Ref Range Status   Adenovirus NOT DETECTED NOT DETECTED Final   Coronavirus 229E NOT DETECTED NOT DETECTED Final    Comment: (NOTE) The Coronavirus on the Respiratory Panel, DOES NOT test for the novel  Coronavirus (2019 nCoV)    Coronavirus HKU1 NOT DETECTED NOT DETECTED Final   Coronavirus NL63 NOT DETECTED NOT DETECTED Final   Coronavirus OC43 NOT DETECTED NOT DETECTED Final   Metapneumovirus NOT DETECTED NOT DETECTED Final   Rhinovirus / Enterovirus NOT DETECTED NOT DETECTED Final   Influenza A NOT DETECTED NOT DETECTED Final   Influenza B NOT  DETECTED NOT DETECTED Final   Parainfluenza Virus 1 NOT DETECTED NOT DETECTED Final   Parainfluenza Virus 2 NOT DETECTED NOT DETECTED Final   Parainfluenza Virus 3 DETECTED (A) NOT DETECTED Final   Parainfluenza Virus 4 NOT DETECTED NOT DETECTED Final   Respiratory Syncytial Virus NOT DETECTED NOT DETECTED Final   Bordetella pertussis NOT DETECTED NOT DETECTED Final   Bordetella Parapertussis NOT DETECTED NOT DETECTED Final   Chlamydophila pneumoniae NOT DETECTED NOT DETECTED Final   Mycoplasma pneumoniae NOT DETECTED NOT DETECTED Final    Comment: Performed at [facility], [address] [place], [place] [address]   Time coordinating discharge: 35 minutes  SIGNED: Lanae Boast, MD  [facility] 12/24/2022, 10:49 AM  If 7PM-7AM, please contact night-coverage [email or web address]

## 2022-12-26 DIAGNOSIS — I4891 Unspecified atrial fibrillation: Secondary | ICD-10-CM | POA: Diagnosis not present

## 2022-12-26 DIAGNOSIS — R262 Difficulty in walking, not elsewhere classified: Secondary | ICD-10-CM | POA: Diagnosis not present

## 2022-12-26 DIAGNOSIS — I5032 Chronic diastolic (congestive) heart failure: Secondary | ICD-10-CM | POA: Diagnosis not present

## 2022-12-26 DIAGNOSIS — E871 Hypo-osmolality and hyponatremia: Secondary | ICD-10-CM | POA: Diagnosis not present

## 2023-01-04 ENCOUNTER — Ambulatory Visit: Payer: Medicare Other | Admitting: Cardiology

## 2023-01-04 NOTE — Progress Notes (Signed)
Cardiology Office Note:    Date:  01/05/2023   ID:  Karina Chase, DOB [birth year], MRN [id number]  PCP:  No primary care provider on file.   [facility] Providers Cardiologist:  None     Referring MD: Baldo Daub, MD   CC: follow up hospitalization  History of Present Illness:    Karina Chase is a 89 y.o. female with a hx of permanent atrial fibrillation, hypertensive heart disease, pulmonary hypertension by most recent echo, chronic diastolic heart failure, asthma, GERD, DM 2, overactive bladder, CKD, breast cancer, vertigo, hyponatremia.  [facility] at the behest of her PCP after recent syncopal event, which EMS noted her to be in atrial fibrillation.  She wore a monitor in February 2022 which revealed atrial fibrillation, no pauses of 3 seconds or greater, rare VE with isolated PVCs and couplets.  Most recently she was admitted on 12/18/2022 to 12/24/2022 at [facility] for acute on chronic diastolic heart failure after presenting with cough, shortness of breath x 1 week.  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  She was noted to have pauses in her atrial fibrillation, she was evaluated by cardiology who advised to avoid nodal blocking agents.  Echo this admission revealed an EF of 60 to 65%, mild LVH, diastolic function could not be evaluated, mildly reduced right systolic function, moderately elevated PASP.  She presents today accompanied by her daughter for follow-up after recent hospitalization.  She has been residing at [facility] for rehab and doing well. She is frustrated by her fluid restriction but understands the need. She is participating in rehab and tolerating well. She does have pedal edema, however it typically resides with elevation. She denies chest pain,  palpitations, dyspnea, pnd, orthopnea, n, v, dizziness, syncope, weight gain, or early satiety.  Past Medical History:  Diagnosis Date   Arthritis    Asthma    Breast cancer (HCC)    Cancer (HCC)    Basal cell skin cancer   Chronic diastolic CHF (congestive heart failure) (HCC)    CKD stage 3b, GFR 30-44 ml/min (HCC)    Colon polyp    GERD (gastroesophageal reflux disease)    History of shingles    Hypertension    Hypertensive heart disease 05/30/2020   Persistent atrial fibrillation (HCC)    Pulmonary hypertension (HCC)    Right groin pain 03/28/2013   Stress incontinence    Tricuspid regurgitation    Varicose veins    Vertigo     Past Surgical History:  Procedure Laterality Date   ABDOMINAL HYSTERECTOMY  1972   BSO   BREAST SURGERY     lumpectomy   SKIN CANCER EXCISION  2014    Current Medications: Current Meds  Medication Sig   acetaminophen (TYLENOL) 325 MG tablet Take 975 mg by mouth 3 (three) times daily as needed for mild pain or moderate pain.   albuterol (VENTOLIN HFA) 108 (90 Base) MCG/ACT inhaler Inhale 1-2 puffs into the lungs every 6 (six) hours as needed for wheezing or shortness of breath.   amLODipine (NORVASC) 10 MG tablet Take 1 tablet (10 mg total) by mouth daily.   Cholecalciferol (VITAMIN D-3) 125 MCG (5000 UT) TABS Take 1 tablet by mouth daily.   cyanocobalamin (VITAMIN B12) 1000 MCG tablet Take 1,000  mcg by mouth daily.   ELIQUIS 2.5 MG TABS tablet TAKE ONE TABLET BY MOUTH TWICE DAILY   furosemide (LASIX) 20 MG tablet Take 2 tablets (40 mg total) by mouth daily.   guaiFENesin (MUCINEX) 600 MG 12 hr tablet Take 600 mg by mouth 2 (two) times daily as needed for to loosen phlegm.   levalbuterol (XOPENEX HFA) 45 MCG/ACT inhaler Inhale 1 puff into the lungs every 4 (four) hours as needed for wheezing.   magnesium hydroxide (MILK OF MAGNESIA) 400 MG/5ML suspension Take 30 mLs by mouth daily as needed for mild constipation.   mometasone-formoterol  (DULERA) 200-5 MCG/ACT AERO Inhale 2 puffs into the lungs 2 (two) times daily for 14 days.   nitroGLYCERIN (NITROSTAT) 0.4 MG SL tablet Place 1 tablet (0.4 mg total) under the tongue every 5 (five) minutes as needed for chest pain.   ondansetron (ZOFRAN-ODT) 4 MG disintegrating tablet Take 4 mg by mouth every 8 (eight) hours.   polyethylene glycol (MIRALAX / GLYCOLAX) 17 g packet Take 17 g by mouth daily.   senna (SENOKOT) 8.6 MG tablet Take 2 tablets by mouth at bedtime.   urea (URE-NA) 15 g PACK oral packet Take 15 g by mouth 2 (two) times daily.   Current Facility-Administered Medications for the 01/05/23 encounter (Office Visit) with Flossie Dibble, NP  Medication   triamcinolone acetonide (KENALOG) 10 MG/ML injection 10 mg     Allergies:   Levofloxacin, Keflex [cephalexin], Prilosec [omeprazole], Sulfa antibiotics, Sulfamethoxazole-trimethoprim, Azithromycin, Penicillin g, and Penicillins   Social History   Socioeconomic History   Marital status: Married    Spouse name: Not on file   Number of children: Not on file   Years of education: Not on file   Highest education level: Not on file  Occupational History   Not on file  Tobacco Use   Smoking status: Never   Smokeless tobacco: Never  Substance and Sexual Activity   Alcohol use: No   Drug use: No   Sexual activity: Not Currently    Birth control/protection: Abstinence  Other Topics Concern   Not on file  Social History Narrative   Not on file   Social Determinants of Health   Financial Resource Strain: Not on file  Food Insecurity: Not on file  Transportation Needs: Not on file  Physical Activity: Not on file  Stress: Not on file  Social Connections: Not on file     Family History: The patient's family history includes Cancer in her maternal grandmother; Heart disease in her father; Stroke in her mother.  ROS:   Please see the history of present illness.     All other systems reviewed and are  negative.  EKGs/Labs/Other Studies Reviewed:    The following studies were reviewed today: Cardiac Studies & Procedures       ECHOCARDIOGRAM  ECHOCARDIOGRAM COMPLETE 12/19/2022  Narrative ECHOCARDIOGRAM REPORT    Patient Name:   Karina Chase Date of Exam: 12/19/2022 Medical Rec #:  [id number]        Height:       63.0 in Accession #:    [id number]       Weight:       154.3 lb Date of Birth:  06-Feb-1925        BSA:          1.732 m Patient Age:    89 years         BP:  155/77 mmHg Patient Gender: F                HR:           81 bpm. Exam Location:  Inpatient  Procedure: 2D Echo, Cardiac Doppler and Color Doppler  Indications:    A flutter  History:        Patient has prior history of Echocardiogram examinations, most recent 06/20/2020. CHF, COPD, Signs/Symptoms:Chest Pain; Risk Factors:Hypertension.  Sonographer:    Melissa Morford RDCS (AE, PE) Referring Phys: [id number] JARED M GARDNER  IMPRESSIONS   1. Left ventricular ejection fraction, by estimation, is 60 to 65%. The left ventricle has normal function. The left ventricle has no regional wall motion abnormalities. There is mild left ventricular hypertrophy. Left ventricular diastolic function could not be evaluated. 2. Right ventricular systolic function is mildly reduced. The right ventricular size is normal. There is moderately elevated pulmonary artery systolic pressure. The estimated right ventricular systolic pressure is 53.4 mmHg. 3. Left atrial size was mildly dilated. 4. Right atrial size was moderately dilated. 5. The mitral valve is normal in structure. Mild mitral valve regurgitation. No evidence of mitral stenosis. 6. The tricuspid valve is abnormal. Tricuspid valve regurgitation is moderate. 7. The aortic valve is tricuspid. Aortic valve regurgitation is not visualized. No aortic stenosis is present. 8. The inferior vena cava is normal in size with <50% respiratory variability, suggesting right  atrial pressure of 8 mmHg.  Comparison(s): No significant change from prior study.  FINDINGS Left Ventricle: Left ventricular ejection fraction, by estimation, is 60 to 65%. The left ventricle has normal function. The left ventricle has no regional wall motion abnormalities. The left ventricular internal cavity size was normal in size. There is mild left ventricular hypertrophy. Left ventricular diastolic function could not be evaluated due to atrial fibrillation. Left ventricular diastolic function could not be evaluated.  Right Ventricle: The right ventricular size is normal. No increase in right ventricular wall thickness. Right ventricular systolic function is mildly reduced. There is moderately elevated pulmonary artery systolic pressure. The tricuspid regurgitant velocity is 3.37 m/s, and with an assumed right atrial pressure of 8 mmHg, the estimated right ventricular systolic pressure is 53.4 mmHg.  Left Atrium: Left atrial size was mildly dilated.  Right Atrium: Right atrial size was moderately dilated.  Pericardium: There is no evidence of pericardial effusion.  Mitral Valve: The mitral valve is normal in structure. Mild mitral valve regurgitation. No evidence of mitral valve stenosis.  Tricuspid Valve: The tricuspid valve is abnormal. Tricuspid valve regurgitation is moderate . No evidence of tricuspid stenosis.  Aortic Valve: The aortic valve is tricuspid. Aortic valve regurgitation is not visualized. No aortic stenosis is present.  Pulmonic Valve: The pulmonic valve was normal in structure. Pulmonic valve regurgitation is not visualized. No evidence of pulmonic stenosis.  Aorta: The aortic root and ascending aorta are structurally normal, with no evidence of dilitation.  Venous: The inferior vena cava is normal in size with less than 50% respiratory variability, suggesting right atrial pressure of 8 mmHg.  IAS/Shunts: No atrial level shunt detected by color flow  Doppler.   LEFT VENTRICLE PLAX 2D LVIDd:         3.80 cm LVIDs:         2.80 cm LV PW:         1.20 cm LV IVS:        1.10 cm LVOT diam:     1.90 cm LV SV:  47 LV SV Index:   27 LVOT Area:     2.84 cm  LV Volumes (MOD) LV vol d, MOD A2C: 84.7 ml LV vol d, MOD A4C: 58.5 ml LV vol s, MOD A2C: 35.0 ml LV vol s, MOD A4C: 35.2 ml LV SV MOD A2C:     49.7 ml LV SV MOD A4C:     58.5 ml LV SV MOD BP:      34.5 ml  RIGHT VENTRICLE RV S prime:     9.90 cm/s TAPSE (M-mode): 1.6 cm  LEFT ATRIUM             Index        RIGHT ATRIUM           Index LA diam:        4.10 cm 2.37 cm/m   RA Area:     18.00 cm LA Vol (A2C):   81.8 ml 47.23 ml/m  RA Volume:   47.30 ml  27.31 ml/m LA Vol (A4C):   49.0 ml 28.29 ml/m LA Biplane Vol: 63.3 ml 36.55 ml/m AORTIC VALVE LVOT Vmax:   81.30 cm/s LVOT Vmean:  64.600 cm/s LVOT VTI:    0.165 m  AORTA Ao Root diam: 3.40 cm Ao Asc diam:  3.40 cm  MITRAL VALVE                TRICUSPID VALVE MV Area (PHT): 4.33 cm     TR Peak grad:   45.4 mmHg MV Decel Time: 175 msec     TR Vmax:        337.00 cm/s MV E velocity: 112.00 cm/s SHUNTS Systemic VTI:  0.16 m Systemic Diam: 1.90 cm  Karina Chase Electronically signed by Winfield Rast Chase Signature Date/Time: 12/19/2022/3:32:05 PM    Final    MONITORS  LONG TERM MONITOR (3-14 DAYS) 10/25/2020  Narrative Patch Wear Time:  3 days and 4 hours (2022-02-08T13:24:08-499 to 2022-02-11T18:15:24-0500)  Atrial Fibrillation occurred continuously (100% burden), ranging from 39-162 bpm (avg of 70 bpm). Isolated VEs were rare (<1.0%), VE Couplets were rare (<1.0%), and no VE Triplets were present.  An event monitor was performed for 3 days and 4 hours assess heart rate response to atrial fibrillation. The cardiac rhythm throughout was atrial fibrillation with average rate of 70 minimum 39 maximum rate of 162 bpm. In general heart rate was well controlled in range 50 to 110 bpm  98% of the time today 97% of the time night and less than 1% greater than 150 bpm. There were no pauses of 3 seconds or greater. Ventricular ectopy was rare with isolated PVCs and couplets. There were no triggered or diary events.   Conclusion: atrial fibrillation with good heart rate control.            EKG:  EKG is  ordered today.  The ekg ordered today demonstrates NSR atrial fibrillation, HR 80 bpm.  Recent Labs: 12/18/2022: ALT 35; B Natriuretic Peptide 599.1 12/21/2022: TSH 2.845 12/23/2022: Magnesium 2.4 12/24/2022: BUN 50; Creatinine, Ser 0.88; Hemoglobin 11.9; Platelets 247; Potassium 4.0; Sodium 129  Recent Lipid Panel No results found for: "CHOL", "TRIG", "HDL", "CHOLHDL", "VLDL", "LDLCALC", "LDLDIRECT"   Risk Assessment/Calculations:    CHA2DS2-VASc Score = 6   This indicates a 9.7% annual risk of stroke. The patient's score is based upon: CHF History: 1 HTN History: 1 Diabetes History: 0 Stroke History: 0 Vascular Disease History: 1 (coronary/aortic atherosclerosis) Age Score: 2 Gender Score: 1  Physical Exam:    VS:  BP 136/60   Pulse 80   Ht 5\' 3"  (1.6 m)   Wt 136 lb (61.7 kg)   SpO2 94%   BMI 24.09 kg/m     Wt Readings from Last 3 Encounters:  01/05/23 136 lb (61.7 kg)  12/24/22 147 lb 7.8 oz (66.9 kg)  10/14/22 140 lb (63.5 kg)     GEN: Appears younger than stated age,  well nourished, well developed in no acute distress HEENT: Normal NECK: No JVD; No carotid bruits LYMPHATICS: No lymphadenopathy CARDIAC:  irregularly irregular, no murmurs, rubs, gallops RESPIRATORY:  Clear to auscultation without rales, wheezing or rhonchi  ABDOMEN: Soft, non-tender, non-distended MUSCULOSKELETAL:  +2 pitting edema bilateral ankles; No deformity  SKIN: Warm and dry NEUROLOGIC:  Alert and oriented x 3 PSYCHIATRIC:  Normal affect   ASSESSMENT:    1. Hypertensive heart disease with chronic diastolic congestive heart failure (HCC)   2.  Persistent atrial fibrillation (HCC)   3. Chronic anticoagulation   4. Stage 3 chronic kidney disease, unspecified whether stage 3a or 3b CKD (HCC)   5. Hypercoagulable state (HCC)   6. Hyponatremia    PLAN:    In order of problems listed above:  Chronic diastolic heart failure-NYHA class II, lung sounds are clear, she does have some pedal edema, appears euvolemic.  She is getting Lasix 40 mg daily at her SNF.  Will have them to weigh her daily and if she has a weight gain of 3 pounds per day or 5 pounds per week will have them give her an additional 40 mg of Lasix.  She is not on a beta-blocker, during her most recent hospitalization she had prolonged pauses and it was recommended that these were avoided.  Will check proBNP today. Persistent atrial fibrillation/hypercoagulable state/chronic anticoagulation-EKG shows rate controlled atrial fibrillation today, CHA2DS2-VASc of 6.  She is currently on reduced dose Eliquis at 2.5 mg twice daily per her primary cardiologist secondary to frailty, propensity to falling, current weight 136 pounds.  Denies hematochezia, hematuria, hemoptysis.  CBC on 12/18/2022 with stable H&H. CKD stage III-most recent GFR greater than 60, careful titration of antihypertensives and diuretics.  She has an appointment to follow-up with a nephrologist at the end of this week for her hyponatremia, see below. Hyponatremia-at discharge her sodium was 129, they feel like her BMET might have been repeated at SNF but they are not entirely sure.  Will repeat her BMET today.  For now continue to avoid 1200 cc total fluid restriction per recommendations of nephrologist.  They have an appointment to be seen by the nephrologist at the end of this week for follow-up of her SIADH. Hypertension-blood pressure today is slightly elevated however will not make any changes currently.   Disposition-BMET today, proBNP, daily weights at SNF with parameters for additional dose of Lasix for weight  gain of 3 pounds per day or 5 pounds per week.  Continue fluid restriction of 1200 cc/day.  Return in 6 weeks.           Medication Adjustments/Labs and Tests Ordered: Current medicines are reviewed at length with the patient today.  Concerns regarding medicines are outlined above.  Orders Placed This Encounter  Procedures   Basic metabolic panel   Pro b natriuretic peptide (BNP)   EKG 12-Lead   No orders of the defined types were placed in this encounter.   Patient Instructions  Medication Instructions:  Your physician has recommended you make the  following change in your medication:  Give pt an extra 40 mg of Lasix for wt gain of 3 pounds in a day or 5 pounds in a week  *If you need a refill on your cardiac medications before your next appointment, please call your pharmacy*   Lab Work: Your physician recommends that you return for lab work in: Today for a BMP and ProBNP  If you have labs (blood work) drawn today and your tests are completely normal, you will receive your results only by: MyChart Message (if you have MyChart) OR A paper copy in the mail If you have any lab test that is abnormal or we need to change your treatment, we will call you to review the results.   Testing/Procedures: NONE   Follow-Up: At [facility], you and your health needs are our priority.  As part of our continuing mission to provide you with exceptional heart care, we have created designated Provider Care Teams.  These Care Teams include your primary Cardiologist (physician) and Advanced Practice Providers (APPs -  Physician Assistants and Nurse Practitioners) who all work together to provide you with the care you need, when you need it.  We recommend signing up for the patient portal called "MyChart".  Sign up information is provided on this After Visit Summary.  MyChart is used to connect with patients for Virtual Visits (Telemedicine).  Patients are able to view lab/test results,  encounter notes, upcoming appointments, etc.  Non-urgent messages can be sent to your provider as well.   To learn more about what you can do with MyChart, go to [email or web address].    Your next appointment:   6 week(s)  Provider:   Wallis Bamberg, NP [facility])    Other Instructions     Signed, Flossie Dibble, NP  01/05/2023 3:05 PM    [facility]

## 2023-01-05 ENCOUNTER — Ambulatory Visit: Payer: Medicare Other | Attending: Cardiology | Admitting: Cardiology

## 2023-01-05 ENCOUNTER — Encounter: Payer: Self-pay | Admitting: Cardiology

## 2023-01-05 VITALS — BP 136/60 | HR 80 | Ht 63.0 in | Wt 136.0 lb

## 2023-01-05 DIAGNOSIS — I4819 Other persistent atrial fibrillation: Secondary | ICD-10-CM | POA: Diagnosis not present

## 2023-01-05 DIAGNOSIS — E871 Hypo-osmolality and hyponatremia: Secondary | ICD-10-CM | POA: Diagnosis not present

## 2023-01-05 DIAGNOSIS — I110 Hypertensive heart disease with heart failure: Secondary | ICD-10-CM | POA: Diagnosis not present

## 2023-01-05 DIAGNOSIS — I5032 Chronic diastolic (congestive) heart failure: Secondary | ICD-10-CM | POA: Diagnosis not present

## 2023-01-05 DIAGNOSIS — N183 Chronic kidney disease, stage 3 unspecified: Secondary | ICD-10-CM

## 2023-01-05 DIAGNOSIS — Z7901 Long term (current) use of anticoagulants: Secondary | ICD-10-CM

## 2023-01-05 DIAGNOSIS — D6859 Other primary thrombophilia: Secondary | ICD-10-CM | POA: Diagnosis not present

## 2023-01-05 NOTE — Patient Instructions (Signed)
Medication Instructions:  Your physician has recommended you make the following change in your medication:  Give pt an extra 40 mg of Lasix for wt gain of 3 pounds in a day or 5 pounds in a week  *If you need a refill on your cardiac medications before your next appointment, please call your pharmacy*   Lab Work: Your physician recommends that you return for lab work in: Today for a BMP and ProBNP  If you have labs (blood work) drawn today and your tests are completely normal, you will receive your results only by: MyChart Message (if you have MyChart) OR A paper copy in the mail If you have any lab test that is abnormal or we need to change your treatment, we will call you to review the results.   Testing/Procedures: NONE   Follow-Up: At [facility], you and your health needs are our priority.  As part of our continuing mission to provide you with exceptional heart care, we have created designated Provider Care Teams.  These Care Teams include your primary Cardiologist (physician) and Advanced Practice Providers (APPs -  Physician Assistants and Nurse Practitioners) who all work together to provide you with the care you need, when you need it.  We recommend signing up for the patient portal called "MyChart".  Sign up information is provided on this After Visit Summary.  MyChart is used to connect with patients for Virtual Visits (Telemedicine).  Patients are able to view lab/test results, encounter notes, upcoming appointments, etc.  Non-urgent messages can be sent to your provider as well.   To learn more about what you can do with MyChart, go to [email or web address].    Your next appointment:   6 week(s)  Provider:   Wallis Bamberg, NP [facility])    Other Instructions

## 2023-01-06 ENCOUNTER — Telehealth: Payer: Self-pay | Admitting: *Deleted

## 2023-01-06 LAB — BASIC METABOLIC PANEL WITH GFR
BUN/Creatinine Ratio: 48 — ABNORMAL HIGH (ref 12–28)
BUN: 56 mg/dL — ABNORMAL HIGH (ref 10–36)
CO2: 26 mmol/L (ref 20–29)
Calcium: 9.4 mg/dL (ref 8.7–10.3)
Chloride: 100 mmol/L (ref 96–106)
Creatinine, Ser: 1.17 mg/dL — ABNORMAL HIGH (ref 0.57–1.00)
Glucose: 230 mg/dL — ABNORMAL HIGH (ref 70–99)
Potassium: 4 mmol/L (ref 3.5–5.2)
Sodium: 141 mmol/L (ref 134–144)
eGFR: 43 mL/min/1.73 — ABNORMAL LOW

## 2023-01-06 LAB — PRO B NATRIURETIC PEPTIDE: NT-Pro BNP: 2352 pg/mL — ABNORMAL HIGH (ref 0–738)

## 2023-01-06 NOTE — Telephone Encounter (Signed)
-----   Message from Flossie Dibble, NP sent at 01/06/2023  9:11 AM EDT ----- She is currently at a nursing home, can we notify her dtr of the results please? Sodium is normal at 141! Creatinine is slightly elevated at 1.17, but this is not worrisome.  Her Pro-BNP level is still high indicating that she is holding onto too much fluid. Continue with fluid restriction for now. Please make sure [facility] is giving her lasix 40 mg daily (the MAR indicated that they were but their wording was kind of confusing), and then an additional lasix for weight gain of 3 lbs/day OR 5lbs/week OR pedal edema--like they were yesterday.  Best, Victorino Dike

## 2023-01-06 NOTE — Telephone Encounter (Signed)
Sent results of labs to pt's PCP. Pt is currently in [facility]

## 2023-01-08 ENCOUNTER — Other Ambulatory Visit: Payer: Self-pay | Admitting: *Deleted

## 2023-01-08 DIAGNOSIS — E871 Hypo-osmolality and hyponatremia: Secondary | ICD-10-CM | POA: Diagnosis not present

## 2023-01-08 DIAGNOSIS — D649 Anemia, unspecified: Secondary | ICD-10-CM | POA: Diagnosis not present

## 2023-01-08 DIAGNOSIS — I5032 Chronic diastolic (congestive) heart failure: Secondary | ICD-10-CM | POA: Diagnosis not present

## 2023-01-08 DIAGNOSIS — I129 Hypertensive chronic kidney disease with stage 1 through stage 4 chronic kidney disease, or unspecified chronic kidney disease: Secondary | ICD-10-CM | POA: Diagnosis not present

## 2023-01-08 DIAGNOSIS — N183 Chronic kidney disease, stage 3 unspecified: Secondary | ICD-10-CM | POA: Diagnosis not present

## 2023-01-08 NOTE — Patient Outreach (Signed)
[facility] Post- Acute Care Coordinator follow up. Karina Chase resides in [facility]. Screening for potential care coordination services as a benefit of health plan and primary care provider.  Update received from Shanda Bumps, [employer] [facility] [facility] psychotherapist. Karina Chase transitioned to LTC.  No identifiable care coordination needs.    Raiford Noble, MSN, RN,BSN [facility] Post Acute Care Coordinator [phone] [facility]) [phone]  (Toll free office)

## 2023-01-11 ENCOUNTER — Other Ambulatory Visit: Payer: Self-pay | Admitting: Cardiology

## 2023-01-12 ENCOUNTER — Other Ambulatory Visit: Payer: Self-pay | Admitting: Nephrology

## 2023-01-12 DIAGNOSIS — N183 Chronic kidney disease, stage 3 unspecified: Secondary | ICD-10-CM

## 2023-01-13 LAB — RESPIRATORY PANEL BY PCR
Adenovirus: NOT DETECTED
Bordetella pertussis: NOT DETECTED
Coronavirus NL63: NOT DETECTED
Coronavirus OC43: NOT DETECTED
Influenza A: NOT DETECTED
Parainfluenza Virus 1: NOT DETECTED
Parainfluenza Virus 2: NOT DETECTED
Parainfluenza Virus 3: DETECTED — AB

## 2023-01-19 ENCOUNTER — Ambulatory Visit
Admission: RE | Admit: 2023-01-19 | Discharge: 2023-01-19 | Disposition: A | Discharge status: Home / Self Care | Payer: Medicare Other | Source: Ambulatory Visit | Attending: Nephrology | Admitting: Nephrology

## 2023-01-19 DIAGNOSIS — N183 Chronic kidney disease, stage 3 unspecified: Secondary | ICD-10-CM

## 2023-01-20 DIAGNOSIS — I509 Heart failure, unspecified: Secondary | ICD-10-CM | POA: Diagnosis not present

## 2023-02-02 DIAGNOSIS — M810 Age-related osteoporosis without current pathological fracture: Secondary | ICD-10-CM | POA: Diagnosis not present

## 2023-02-02 DIAGNOSIS — R5381 Other malaise: Secondary | ICD-10-CM | POA: Diagnosis not present

## 2023-02-02 DIAGNOSIS — K219 Gastro-esophageal reflux disease without esophagitis: Secondary | ICD-10-CM | POA: Diagnosis not present

## 2023-02-02 DIAGNOSIS — I4891 Unspecified atrial fibrillation: Secondary | ICD-10-CM | POA: Diagnosis not present

## 2023-02-02 DIAGNOSIS — I119 Hypertensive heart disease without heart failure: Secondary | ICD-10-CM | POA: Diagnosis not present

## 2023-02-02 DIAGNOSIS — E871 Hypo-osmolality and hyponatremia: Secondary | ICD-10-CM | POA: Diagnosis not present

## 2023-02-02 DIAGNOSIS — N1831 Chronic kidney disease, stage 3a: Secondary | ICD-10-CM | POA: Diagnosis not present

## 2023-02-02 DIAGNOSIS — I10 Essential (primary) hypertension: Secondary | ICD-10-CM | POA: Diagnosis not present

## 2023-02-02 DIAGNOSIS — D649 Anemia, unspecified: Secondary | ICD-10-CM | POA: Diagnosis not present

## 2023-02-02 DIAGNOSIS — M549 Dorsalgia, unspecified: Secondary | ICD-10-CM | POA: Diagnosis not present

## 2023-02-02 DIAGNOSIS — M1990 Unspecified osteoarthritis, unspecified site: Secondary | ICD-10-CM | POA: Diagnosis not present

## 2023-02-02 DIAGNOSIS — E441 Mild protein-calorie malnutrition: Secondary | ICD-10-CM | POA: Diagnosis not present

## 2023-02-02 DIAGNOSIS — K5900 Constipation, unspecified: Secondary | ICD-10-CM | POA: Diagnosis not present

## 2023-02-02 DIAGNOSIS — I1310 Hypertensive heart and chronic kidney disease without heart failure, with stage 1 through stage 4 chronic kidney disease, or unspecified chronic kidney disease: Secondary | ICD-10-CM | POA: Diagnosis not present

## 2023-02-02 DIAGNOSIS — I5032 Chronic diastolic (congestive) heart failure: Secondary | ICD-10-CM | POA: Diagnosis not present

## 2023-02-02 DIAGNOSIS — I509 Heart failure, unspecified: Secondary | ICD-10-CM | POA: Diagnosis not present

## 2023-02-02 DIAGNOSIS — M2550 Pain in unspecified joint: Secondary | ICD-10-CM | POA: Diagnosis not present

## 2023-02-05 DIAGNOSIS — I509 Heart failure, unspecified: Secondary | ICD-10-CM | POA: Diagnosis not present

## 2023-02-05 DIAGNOSIS — R944 Abnormal results of kidney function studies: Secondary | ICD-10-CM | POA: Diagnosis not present

## 2023-02-11 DIAGNOSIS — I071 Rheumatic tricuspid insufficiency: Secondary | ICD-10-CM | POA: Insufficient documentation

## 2023-02-11 DIAGNOSIS — I272 Pulmonary hypertension, unspecified: Secondary | ICD-10-CM | POA: Insufficient documentation

## 2023-02-11 DIAGNOSIS — N1832 Chronic kidney disease, stage 3b: Secondary | ICD-10-CM | POA: Insufficient documentation

## 2023-02-16 ENCOUNTER — Ambulatory Visit: Payer: Medicare Other | Attending: Cardiology | Admitting: Cardiology

## 2023-02-16 ENCOUNTER — Encounter: Payer: Self-pay | Admitting: Cardiology

## 2023-02-16 VITALS — BP 154/56 | HR 76 | Ht 63.0 in | Wt 133.4 lb

## 2023-02-16 DIAGNOSIS — D6859 Other primary thrombophilia: Secondary | ICD-10-CM | POA: Diagnosis not present

## 2023-02-16 DIAGNOSIS — I4819 Other persistent atrial fibrillation: Secondary | ICD-10-CM

## 2023-02-16 DIAGNOSIS — Z7901 Long term (current) use of anticoagulants: Secondary | ICD-10-CM

## 2023-02-16 DIAGNOSIS — I5030 Unspecified diastolic (congestive) heart failure: Secondary | ICD-10-CM

## 2023-02-16 MED ORDER — FUROSEMIDE 20 MG PO TABS: 20.0000 mg | ORAL_TABLET | Freq: Two times a day (BID) | ORAL | 3 refills | Status: DC

## 2023-02-16 NOTE — Progress Notes (Signed)
Cardiology Office Note:    Date:  02/16/2023   ID:  Karina Chase, DOB 1925-08-09, MRN [id number]  PCP:  Johny Blamer, MD   [facility] Providers Cardiologist:  Norman Herrlich, MD     Referring MD: No ref. provider found   CC: follow up heart failure  History of Present Illness:    Karina Chase is a 89 y.o. female with a hx of permanent atrial fibrillation, hypertensive heart disease, pulmonary hypertension by most recent echo, chronic diastolic heart failure, asthma, GERD, DM 2, overactive bladder, CKD, breast cancer, vertigo, hyponatremia.  [facility] at the behest of her PCP after recent syncopal event, which EMS noted her to be in atrial fibrillation.  She wore a monitor in February 2022 which revealed atrial fibrillation, no pauses of 3 seconds or greater, rare VE with isolated PVCs and couplets.  She was admitted on 12/18/2022 to 12/24/2022 at [facility] for acute on chronic diastolic heart failure after presenting with cough, shortness of breath x 1 week.  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  She was noted to have pauses in her atrial fibrillation, she was evaluated by cardiology who advised to avoid nodal blocking agents.  Echo this admission revealed an EF of 60 to 65%, mild LVH, diastolic function could not be evaluated, mildly reduced right systolic function, moderately elevated PASP.  She was evaluated on 01/05/2023 post hospitalization presenting in a wheelchair. She was residing at [facility] s/p post hospitalization and doing well but frustrated by fluid restriction.   Presents today accompanied by her daughter for follow-up, today she is ambulating without an assistive device.  She is doing much better, her breathing feels better, she offers no formal complaint today.   They are discussing moving her out of collapse to an independent nursing facility.  She voices some concern with how the nursing facility is giving her Lasix, she is still taking it twice a day at 8 AM and 8 PM, she is not able to get any rest.  They are weighing her daily, she states she hardly ever has any swelling in her ankles. She denies chest pain, palpitations, dyspnea, pnd, orthopnea, n, v, dizziness, syncope, edema, weight gain, or early satiety.   Past Medical History:  Diagnosis Date   Arthritis    Asthma    Breast cancer (HCC)    Cancer (HCC)    Basal cell skin cancer   Chronic diastolic CHF (congestive heart failure) (HCC)    CKD stage 3b, GFR 30-44 ml/min (HCC)    Colon polyp    GERD (gastroesophageal reflux disease)    History of shingles    Hypertension    Hypertensive heart disease 05/30/2020   Persistent atrial fibrillation (HCC)    Pulmonary hypertension (HCC)    Right groin pain 03/28/2013   Stress incontinence    Tricuspid regurgitation    Varicose veins    Vertigo     Past Surgical History:  Procedure Laterality Date   ABDOMINAL HYSTERECTOMY  1972   BSO   BREAST SURGERY     lumpectomy   SKIN CANCER EXCISION  2014    Current Medications: Current Meds  Medication Sig   albuterol (VENTOLIN HFA) 108 (90 Base) MCG/ACT inhaler Inhale 1-2 puffs into the lungs every 6 (six) hours  as needed for wheezing or shortness of breath.   amLODipine (NORVASC) 10 MG tablet Take 1 tablet (10 mg total) by mouth daily.   Cholecalciferol (VITAMIN D-3) 125 MCG (5000 UT) TABS Take 1 tablet by mouth daily.   cyanocobalamin (VITAMIN B12) 1000 MCG tablet Take 1,000 mcg by mouth daily.   ELIQUIS 2.5 MG TABS tablet TAKE ONE TABLET BY MOUTH TWICE DAILY   furosemide (LASIX) 20 MG tablet Take 1 tablet (20 mg total) by mouth 2 (two) times daily. Please give this medication at 8 am and 2 pm starting on 02/17/23   magnesium hydroxide (MILK OF MAGNESIA) 400 MG/5ML suspension Take 30 mLs by  mouth daily as needed for mild constipation.   nitroGLYCERIN (NITROSTAT) 0.4 MG SL tablet Place 1 tablet (0.4 mg total) under the tongue every 5 (five) minutes as needed for chest pain.   ondansetron (ZOFRAN-ODT) 4 MG disintegrating tablet Take 4 mg by mouth every 8 (eight) hours.   polyethylene glycol (MIRALAX / GLYCOLAX) 17 g packet Take 17 g by mouth daily as needed.   senna (SENOKOT) 8.6 MG tablet Take 2 tablets by mouth at bedtime.   urea (URE-NA) 15 g PACK oral packet Take 15 g by mouth 2 (two) times daily.   [DISCONTINUED] furosemide (LASIX) 20 MG tablet Take 2 tablets (40 mg total) by mouth daily.   Current Facility-Administered Medications for the 02/16/23 encounter (Office Visit) with Flossie Dibble, NP  Medication   triamcinolone acetonide (KENALOG) 10 MG/ML injection 10 mg     Allergies:   Levofloxacin, Keflex [cephalexin], Prilosec [omeprazole], Sulfa antibiotics, Sulfamethoxazole-trimethoprim, Azithromycin, Penicillin g, and Penicillins   Social History   Socioeconomic History   Marital status: Married    Spouse name: Not on file   Number of children: Not on file   Years of education: Not on file   Highest education level: Not on file  Occupational History   Not on file  Tobacco Use   Smoking status: Never   Smokeless tobacco: Never  Substance and Sexual Activity   Alcohol use: No   Drug use: No   Sexual activity: Not Currently    Birth control/protection: Abstinence  Other Topics Concern   Not on file  Social History Narrative   Not on file   Social Determinants of Health   Financial Resource Strain: Not on file  Food Insecurity: Not on file  Transportation Needs: Not on file  Physical Activity: Not on file  Stress: Not on file  Social Connections: Not on file     Family History: The patient's family history includes Cancer in her maternal grandmother; Heart disease in her father; Stroke in her mother.  ROS:   Please see the history of present  illness.     All other systems reviewed and are negative.  EKGs/Labs/Other Studies Reviewed:    The following studies were reviewed today: Cardiac Studies & Procedures       ECHOCARDIOGRAM  ECHOCARDIOGRAM COMPLETE 12/19/2022  Narrative ECHOCARDIOGRAM REPORT    Patient Name:   Karina Chase Date of Exam: 12/19/2022 Medical Rec #:  [id number]        Height:       63.0 in Accession #:    [id number]       Weight:       154.3 lb Date of Birth:  [birth year]        BSA:          1.732 m Patient Age:    89  years         BP:           155/77 mmHg Patient Gender: F                HR:           81 bpm. Exam Location:  Inpatient  Procedure: 2D Echo, Cardiac Doppler and Color Doppler  Indications:    A flutter  History:        Patient has prior history of Echocardiogram examinations, most recent 06/20/2020. CHF, COPD, Signs/Symptoms:Chest Pain; Risk Factors:Hypertension.  Sonographer:    Melissa Morford RDCS (AE, PE) Referring Phys: [id number] JARED M GARDNER  IMPRESSIONS   1. Left ventricular ejection fraction, by estimation, is 60 to 65%. The left ventricle has normal function. The left ventricle has no regional wall motion abnormalities. There is mild left ventricular hypertrophy. Left ventricular diastolic function could not be evaluated. 2. Right ventricular systolic function is mildly reduced. The right ventricular size is normal. There is moderately elevated pulmonary artery systolic pressure. The estimated right ventricular systolic pressure is 53.4 mmHg. 3. Left atrial size was mildly dilated. 4. Right atrial size was moderately dilated. 5. The mitral valve is normal in structure. Mild mitral valve regurgitation. No evidence of mitral stenosis. 6. The tricuspid valve is abnormal. Tricuspid valve regurgitation is moderate. 7. The aortic valve is tricuspid. Aortic valve regurgitation is not visualized. No aortic stenosis is present. 8. The inferior vena cava is normal in size with  <50% respiratory variability, suggesting right atrial pressure of 8 mmHg.  Comparison(s): No significant change from prior study.  FINDINGS Left Ventricle: Left ventricular ejection fraction, by estimation, is 60 to 65%. The left ventricle has normal function. The left ventricle has no regional wall motion abnormalities. The left ventricular internal cavity size was normal in size. There is mild left ventricular hypertrophy. Left ventricular diastolic function could not be evaluated due to atrial fibrillation. Left ventricular diastolic function could not be evaluated.  Right Ventricle: The right ventricular size is normal. No increase in right ventricular wall thickness. Right ventricular systolic function is mildly reduced. There is moderately elevated pulmonary artery systolic pressure. The tricuspid regurgitant velocity is 3.37 m/s, and with an assumed right atrial pressure of 8 mmHg, the estimated right ventricular systolic pressure is 53.4 mmHg.  Left Atrium: Left atrial size was mildly dilated.  Right Atrium: Right atrial size was moderately dilated.  Pericardium: There is no evidence of pericardial effusion.  Mitral Valve: The mitral valve is normal in structure. Mild mitral valve regurgitation. No evidence of mitral valve stenosis.  Tricuspid Valve: The tricuspid valve is abnormal. Tricuspid valve regurgitation is moderate . No evidence of tricuspid stenosis.  Aortic Valve: The aortic valve is tricuspid. Aortic valve regurgitation is not visualized. No aortic stenosis is present.  Pulmonic Valve: The pulmonic valve was normal in structure. Pulmonic valve regurgitation is not visualized. No evidence of pulmonic stenosis.  Aorta: The aortic root and ascending aorta are structurally normal, with no evidence of dilitation.  Venous: The inferior vena cava is normal in size with less than 50% respiratory variability, suggesting right atrial pressure of 8 mmHg.  IAS/Shunts: No atrial  level shunt detected by color flow Doppler.   LEFT VENTRICLE PLAX 2D LVIDd:         3.80 cm LVIDs:         2.80 cm LV PW:         1.20 cm LV IVS:  1.10 cm LVOT diam:     1.90 cm LV SV:         47 LV SV Index:   27 LVOT Area:     2.84 cm  LV Volumes (MOD) LV vol d, MOD A2C: 84.7 ml LV vol d, MOD A4C: 58.5 ml LV vol s, MOD A2C: 35.0 ml LV vol s, MOD A4C: 35.2 ml LV SV MOD A2C:     49.7 ml LV SV MOD A4C:     58.5 ml LV SV MOD BP:      34.5 ml  RIGHT VENTRICLE RV S prime:     9.90 cm/s TAPSE (M-mode): 1.6 cm  LEFT ATRIUM             Index        RIGHT ATRIUM           Index LA diam:        4.10 cm 2.37 cm/m   RA Area:     18.00 cm LA Vol (A2C):   81.8 ml 47.23 ml/m  RA Volume:   47.30 ml  27.31 ml/m LA Vol (A4C):   49.0 ml 28.29 ml/m LA Biplane Vol: 63.3 ml 36.55 ml/m AORTIC VALVE LVOT Vmax:   81.30 cm/s LVOT Vmean:  64.600 cm/s LVOT VTI:    0.165 m  AORTA Ao Root diam: 3.40 cm Ao Asc diam:  3.40 cm  MITRAL VALVE                TRICUSPID VALVE MV Area (PHT): 4.33 cm     TR Peak grad:   45.4 mmHg MV Decel Time: 175 msec     TR Vmax:        337.00 cm/s MV E velocity: 112.00 cm/s SHUNTS Systemic VTI:  0.16 m Systemic Diam: 1.90 cm  Vishnu Priya Mallipeddi Electronically signed by Winfield Rast Mallipeddi Signature Date/Time: 12/19/2022/3:32:05 PM    Final    MONITORS  LONG TERM MONITOR (3-14 DAYS) 10/25/2020  Narrative Patch Wear Time:  3 days and 4 hours (2022-02-08T13:24:08-499 to 2022-02-11T18:15:24-0500)  Atrial Fibrillation occurred continuously (100% burden), ranging from 39-162 bpm (avg of 70 bpm). Isolated VEs were rare (<1.0%), VE Couplets were rare (<1.0%), and no VE Triplets were present.  An event monitor was performed for 3 days and 4 hours assess heart rate response to atrial fibrillation. The cardiac rhythm throughout was atrial fibrillation with average rate of 70 minimum 39 maximum rate of 162 bpm. In general heart rate was  well controlled in range 50 to 110 bpm 98% of the time today 97% of the time night and less than 1% greater than 150 bpm. There were no pauses of 3 seconds or greater. Ventricular ectopy was rare with isolated PVCs and couplets. There were no triggered or diary events.   Conclusion: atrial fibrillation with good heart rate control.            EKG:  EKG is not ordered today.   Recent Labs: 12/18/2022: ALT 35; B Natriuretic Peptide 599.1 12/21/2022: TSH 2.845 12/23/2022: Magnesium 2.4 12/24/2022: Hemoglobin 11.9; Platelets 247 01/05/2023: BUN 56; Creatinine, Ser 1.17; NT-Pro BNP 2,352; Potassium 4.0; Sodium 141  Recent Lipid Panel No results found for: "CHOL", "TRIG", "HDL", "CHOLHDL", "VLDL", "LDLCALC", "LDLDIRECT"   Risk Assessment/Calculations:    CHA2DS2-VASc Score = 6   This indicates a 9.7% annual risk of stroke. The patient's score is based upon: CHF History: 1 HTN History: 1 Diabetes History: 0 Stroke History: 0 Vascular Disease  History: 1 (coronary/aortic atherosclerosis) Age Score: 2 Gender Score: 1          Physical Exam:    VS:  BP (!) 154/56 (BP Location: Left Arm, Patient Position: Sitting, Cuff Size: Small)   Pulse 76   Ht 5\' 3"  (1.6 m)   Wt 133 lb 6.4 oz (60.5 kg)   SpO2 96%   BMI 23.63 kg/m     Wt Readings from Last 3 Encounters:  02/16/23 133 lb 6.4 oz (60.5 kg)  01/05/23 136 lb (61.7 kg)  12/24/22 147 lb 7.8 oz (66.9 kg)     GEN: Appears younger than stated age,  well nourished, well developed in no acute distress HEENT: Normal NECK: No JVD; No carotid bruits LYMPHATICS: No lymphadenopathy CARDIAC:  irregularly irregular, no murmurs, rubs, gallops RESPIRATORY:  Clear to auscultation without rales, wheezing or rhonchi  ABDOMEN: Soft, non-tender, non-distended MUSCULOSKELETAL: No edema; No deformity  SKIN: Warm and dry NEUROLOGIC:  Alert and oriented x 3 PSYCHIATRIC:  Normal affect   ASSESSMENT:    1. Persistent atrial fibrillation (HCC)    2. Chronic anticoagulation   3. Hypercoagulable state (HCC)   4. Heart failure with preserved ejection fraction, unspecified HF chronicity (HCC)     PLAN:    In order of problems listed above:  Chronic diastolic heart failure-NYHA class I, euvolemic, lung sounds clear.  She is getting Lasix 20 mg twice daily at her SNF, we will change this to be administered at 8 AM and 2 PM.  Will have them to weigh her daily and if she has a weight gain of 3 pounds per day or 5 pounds per week will have them give her an additional 40 mg of Lasix.  She is not on a beta-blocker, during her most recent hospitalization she had prolonged pauses and it was recommended that these were avoided.  She is very bothered by excessive thirst, will increase her fluid intake to 1500 mL/day.  Repeat BMET today. Persistent atrial fibrillation/hypercoagulable state/chronic anticoagulation-heart rate is controlled today at 76 bpm, CHA2DS2-VASc score of 6.  She is currently on reduced dose Eliquis at 2.5 mg twice daily per her primary cardiologist secondary to frailty, propensity to falling, current weight 133 pounds.  Denies hematochezia, hematuria, hemoptysis.  CBC on 12/18/2022 with stable H&H. CKD stage III-most recent GFR 43 careful titration of antihypertensives and diuretics.  For now, we will continue her Lasix 20 mg daily at 8 AM and 2 PM. Hyponatremia-most recent sodium 141 Hypertension-blood pressure today is slightly elevated however will not make any changes currently  Disposition-BMET today.  Change Lasix to be administered at 8 AM and 2 PM at facility.  Increase total fluid intake to 1500 mL/day.  Continue daily weights.  Return in 3 months.           Medication Adjustments/Labs and Tests Ordered: Current medicines are reviewed at length with the patient today.  Concerns regarding medicines are outlined above.  Orders Placed This Encounter  Procedures   Basic Metabolic Panel (BMET)   Meds ordered this  encounter  Medications   furosemide (LASIX) 20 MG tablet    Sig: Take 1 tablet (20 mg total) by mouth 2 (two) times daily. Please give this medication at 8 am and 2 pm starting on 02/17/23    Dispense:  180 tablet    Refill:  3    Patient Instructions  Medication Instructions:  Your physician has recommended you make the following change in your medication:  HOLD Lasix tonight START: Lasix 20 mg twice daily (Please give Lasix at 8 am and 2 pm starting on 02/17/23  *If you need a refill on your cardiac medications before your next appointment, please call your pharmacy*   Lab Work: Your physician recommends that you return for lab work in:   Labs today: BMP  If you have labs (blood work) drawn today and your tests are completely normal, you will receive your results only by: [employer] (if you have MyChart) OR A paper copy in the mail If you have any lab test that is abnormal or we need to change your treatment, we will call you to review the results.   Testing/Procedures: None   Follow-Up: At [facility], you and your health needs are our priority.  As part of our continuing mission to provide you with exceptional heart care, we have created designated Provider Care Teams.  These Care Teams include your primary Cardiologist (physician) and Advanced Practice Providers (APPs -  Physician Assistants and Nurse Practitioners) who all work together to provide you with the care you need, when you need it.  We recommend signing up for the patient portal called "MyChart".  Sign up information is provided on this After Visit Summary.  MyChart is used to connect with patients for Virtual Visits (Telemedicine).  Patients are able to view lab/test results, encounter notes, upcoming appointments, etc.  Non-urgent messages can be sent to your provider as well.   To learn more about what you can do with MyChart, go to [email or web address].    Your next appointment:   3  month(s)  Provider:   Norman Herrlich, MD    Other Instructions Keep weighing daily  Increase fluid intake to per day    Signed, Flossie Dibble, NP  02/16/2023 4:01 PM    [facility]

## 2023-02-16 NOTE — Progress Notes (Signed)
Cardiology Office Note:    Date:  02/16/2023   ID:  OLLA SERVISS, DOB 04/06/1925, MRN [id number]  PCP:  Johny Blamer, MD   [facility] Providers Cardiologist:  None     Referring MD: No ref. provider found   CC: follow up heart failure  History of Present Illness:    Karina Chase is a 89 y.o. female with a hx of permanent atrial fibrillation, hypertensive heart disease, pulmonary hypertension by most recent echo, chronic diastolic heart failure, asthma, GERD, DM 2, overactive bladder, CKD, breast cancer, vertigo, hyponatremia.  She established with [facility] in 2021 at the behest of her PCP after recent syncopal event, which EMS noted her to be in atrial fibrillation.  She wore a monitor in February 2022 which revealed atrial fibrillation, no pauses of 3 seconds or greater, rare VE with isolated PVCs and couplets.  She was admitted on 12/18/2022 to 12/24/2022 at [facility] for acute on chronic diastolic heart failure after presenting with cough, shortness of breath x 1 week.  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  She was noted to have pauses in her atrial fibrillation, she was evaluated by cardiology who advised to avoid nodal blocking agents.  Echo this admission revealed an EF of 60 to 65%, mild LVH, diastolic function could not be evaluated, mildly reduced right systolic function, moderately elevated PASP.  She was evaluated on 01/05/2023 post hospitalization presenting in a wheelchair. She was residing at [facility] s/p post hospitalization and doing well but frustrated by fluid restriction.   Presents today accompanied by her daughter for follow-up, today she is ambulating without an assistive device.  She is doing much better, her breathing feels better, she offers no formal complaint today.  They are  discussing moving her out of collapse to an independent nursing facility.  She voices some concern with how the nursing facility is giving her Lasix, she is still taking it twice a day at 8 AM and 8 PM, she is not able to get any rest.  They are weighing her daily, she states she hardly ever has any swelling in her ankles. She denies chest pain, palpitations, dyspnea, pnd, orthopnea, n, v, dizziness, syncope, edema, weight gain, or early satiety.   Past Medical History:  Diagnosis Date   Arthritis    Asthma    Breast cancer (HCC)    Cancer (HCC)    Basal cell skin cancer   Chronic diastolic CHF (congestive heart failure) (HCC)    CKD stage 3b, GFR 30-44 ml/min (HCC)    Colon polyp    GERD (gastroesophageal reflux disease)    History of shingles    Hypertension    Hypertensive heart disease 05/30/2020   Persistent atrial fibrillation (HCC)    Pulmonary hypertension (HCC)    Right groin pain 03/28/2013   Stress incontinence    Tricuspid regurgitation    Varicose veins    Vertigo     Past Surgical History:  Procedure Laterality Date   ABDOMINAL HYSTERECTOMY  1972   BSO   BREAST SURGERY     lumpectomy   SKIN CANCER EXCISION  2014    Current Medications: Current Meds  Medication Sig   albuterol (VENTOLIN HFA) 108 (90 Base) MCG/ACT inhaler Inhale 1-2 puffs into the lungs every 6 (six) hours as needed  for wheezing or shortness of breath.   amLODipine (NORVASC) 10 MG tablet Take 1 tablet (10 mg total) by mouth daily.   Cholecalciferol (VITAMIN D-3) 125 MCG (5000 UT) TABS Take 1 tablet by mouth daily.   cyanocobalamin (VITAMIN B12) 1000 MCG tablet Take 1,000 mcg by mouth daily.   ELIQUIS 2.5 MG TABS tablet TAKE ONE TABLET BY MOUTH TWICE DAILY   furosemide (LASIX) 20 MG tablet Take 1 tablet (20 mg total) by mouth 2 (two) times daily. Please give this medication at 8 am and 2 pm starting on 02/17/23   magnesium hydroxide (MILK OF MAGNESIA) 400 MG/5ML suspension Take 30 mLs by mouth  daily as needed for mild constipation.   nitroGLYCERIN (NITROSTAT) 0.4 MG SL tablet Place 1 tablet (0.4 mg total) under the tongue every 5 (five) minutes as needed for chest pain.   ondansetron (ZOFRAN-ODT) 4 MG disintegrating tablet Take 4 mg by mouth every 8 (eight) hours.   polyethylene glycol (MIRALAX / GLYCOLAX) 17 g packet Take 17 g by mouth daily as needed.   senna (SENOKOT) 8.6 MG tablet Take 2 tablets by mouth at bedtime.   urea (URE-NA) 15 g PACK oral packet Take 15 g by mouth 2 (two) times daily.   [DISCONTINUED] furosemide (LASIX) 20 MG tablet Take 2 tablets (40 mg total) by mouth daily.   Current Facility-Administered Medications for the 02/16/23 encounter (Office Visit) with Flossie Dibble, NP  Medication   triamcinolone acetonide (KENALOG) 10 MG/ML injection 10 mg     Allergies:   Levofloxacin, Keflex [cephalexin], Prilosec [omeprazole], Sulfa antibiotics, Sulfamethoxazole-trimethoprim, Azithromycin, Penicillin g, and Penicillins   Social History   Socioeconomic History   Marital status: Married    Spouse name: Not on file   Number of children: Not on file   Years of education: Not on file   Highest education level: Not on file  Occupational History   Not on file  Tobacco Use   Smoking status: Never   Smokeless tobacco: Never  Substance and Sexual Activity   Alcohol use: No   Drug use: No   Sexual activity: Not Currently    Birth control/protection: Abstinence  Other Topics Concern   Not on file  Social History Narrative   Not on file   Social Determinants of Health   Financial Resource Strain: Not on file  Food Insecurity: Not on file  Transportation Needs: Not on file  Physical Activity: Not on file  Stress: Not on file  Social Connections: Not on file     Family History: The patient's family history includes Cancer in her maternal grandmother; Heart disease in her father; Stroke in her mother.  ROS:   Please see the history of present illness.      All other systems reviewed and are negative.  EKGs/Labs/Other Studies Reviewed:    The following studies were reviewed today: Cardiac Studies & Procedures      ECHOCARDIOGRAM  ECHOCARDIOGRAM COMPLETE 12/19/2022  Narrative ECHOCARDIOGRAM REPORT    Patient Name:   [name] Date of Exam: 12/19/2022 Medical Rec #:  [id number]        Height:       63.0 in Accession #:    [id number]       Weight:       154.3 lb Date of Birth:  1925/03/09        BSA:          1.732 m Patient Age:    89 years  BP:           155/77 mmHg Patient Gender: F                HR:           81 bpm. Exam Location:  Inpatient  Procedure: 2D Echo, Cardiac Doppler and Color Doppler  Indications:    A flutter  History:        Patient has prior history of Echocardiogram examinations, most recent 06/20/2020. CHF, COPD, Signs/Symptoms:Chest Pain; Risk Factors:Hypertension.  Sonographer:    Melissa Morford RDCS (AE, PE) Referring Phys: [id number] JARED M GARDNER  IMPRESSIONS   1. Left ventricular ejection fraction, by estimation, is 60 to 65%. The left ventricle has normal function. The left ventricle has no regional wall motion abnormalities. There is mild left ventricular hypertrophy. Left ventricular diastolic function could not be evaluated. 2. Right ventricular systolic function is mildly reduced. The right ventricular size is normal. There is moderately elevated pulmonary artery systolic pressure. The estimated right ventricular systolic pressure is 53.4 mmHg. 3. Left atrial size was mildly dilated. 4. Right atrial size was moderately dilated. 5. The mitral valve is normal in structure. Mild mitral valve regurgitation. No evidence of mitral stenosis. 6. The tricuspid valve is abnormal. Tricuspid valve regurgitation is moderate. 7. The aortic valve is tricuspid. Aortic valve regurgitation is not visualized. No aortic stenosis is present. 8. The inferior vena cava is normal in size with <50%  respiratory variability, suggesting right atrial pressure of 8 mmHg.  Comparison(s): No significant change from prior study.  FINDINGS Left Ventricle: Left ventricular ejection fraction, by estimation, is 60 to 65%. The left ventricle has normal function. The left ventricle has no regional wall motion abnormalities. The left ventricular internal cavity size was normal in size. There is mild left ventricular hypertrophy. Left ventricular diastolic function could not be evaluated due to atrial fibrillation. Left ventricular diastolic function could not be evaluated.  Right Ventricle: The right ventricular size is normal. No increase in right ventricular wall thickness. Right ventricular systolic function is mildly reduced. There is moderately elevated pulmonary artery systolic pressure. The tricuspid regurgitant velocity is 3.37 m/s, and with an assumed right atrial pressure of 8 mmHg, the estimated right ventricular systolic pressure is 53.4 mmHg.  Left Atrium: Left atrial size was mildly dilated.  Right Atrium: Right atrial size was moderately dilated.  Pericardium: There is no evidence of pericardial effusion.  Mitral Valve: The mitral valve is normal in structure. Mild mitral valve regurgitation. No evidence of mitral valve stenosis.  Tricuspid Valve: The tricuspid valve is abnormal. Tricuspid valve regurgitation is moderate . No evidence of tricuspid stenosis.  Aortic Valve: The aortic valve is tricuspid. Aortic valve regurgitation is not visualized. No aortic stenosis is present.  Pulmonic Valve: The pulmonic valve was normal in structure. Pulmonic valve regurgitation is not visualized. No evidence of pulmonic stenosis.  Aorta: The aortic root and ascending aorta are structurally normal, with no evidence of dilitation.  Venous: The inferior vena cava is normal in size with less than 50% respiratory variability, suggesting right atrial pressure of 8 mmHg.  IAS/Shunts: No atrial level  shunt detected by color flow Doppler.   LEFT VENTRICLE PLAX 2D LVIDd:         3.80 cm LVIDs:         2.80 cm LV PW:         1.20 cm LV IVS:        1.10 cm LVOT diam:  1.90 cm LV SV:         47 LV SV Index:   27 LVOT Area:     2.84 cm  LV Volumes (MOD) LV vol d, MOD A2C: 84.7 ml LV vol d, MOD A4C: 58.5 ml LV vol s, MOD A2C: 35.0 ml LV vol s, MOD A4C: 35.2 ml LV SV MOD A2C:     49.7 ml LV SV MOD A4C:     58.5 ml LV SV MOD BP:      34.5 ml  RIGHT VENTRICLE RV S prime:     9.90 cm/s TAPSE (M-mode): 1.6 cm  LEFT ATRIUM             Index        RIGHT ATRIUM           Index LA diam:        4.10 cm 2.37 cm/m   RA Area:     18.00 cm LA Vol (A2C):   81.8 ml 47.23 ml/m  RA Volume:   47.30 ml  27.31 ml/m LA Vol (A4C):   49.0 ml 28.29 ml/m LA Biplane Vol: 63.3 ml 36.55 ml/m AORTIC VALVE LVOT Vmax:   81.30 cm/s LVOT Vmean:  64.600 cm/s LVOT VTI:    0.165 m  AORTA Ao Root diam: 3.40 cm Ao Asc diam:  3.40 cm  MITRAL VALVE                TRICUSPID VALVE MV Area (PHT): 4.33 cm     TR Peak grad:   45.4 mmHg MV Decel Time: 175 msec     TR Vmax:        337.00 cm/s MV E velocity: 112.00 cm/s SHUNTS Systemic VTI:  0.16 m Systemic Diam: 1.90 cm  Vishnu Priya Mallipeddi Electronically signed by Winfield Rast Mallipeddi Signature Date/Time: 12/19/2022/3:32:05 PM    Final   MONITORS  LONG TERM MONITOR (3-14 DAYS) 10/25/2020  Narrative Patch Wear Time:  3 days and 4 hours (2022-02-08T13:24:08-499 to 2022-02-11T18:15:24-0500)  Atrial Fibrillation occurred continuously (100% burden), ranging from 39-162 bpm (avg of 70 bpm). Isolated VEs were rare (<1.0%), VE Couplets were rare (<1.0%), and no VE Triplets were present.  An event monitor was performed for 3 days and 4 hours assess heart rate response to atrial fibrillation. The cardiac rhythm throughout was atrial fibrillation with average rate of 70 minimum 39 maximum rate of 162 bpm. In general heart rate was well  controlled in range 50 to 110 bpm 98% of the time today 97% of the time night and less than 1% greater than 150 bpm. There were no pauses of 3 seconds or greater. Ventricular ectopy was rare with isolated PVCs and couplets. There were no triggered or diary events.   Conclusion: atrial fibrillation with good heart rate control.            EKG:  EKG is  ordered today.  The ekg ordered today demonstrates NSR atrial fibrillation, HR 80 bpm.  Recent Labs: 12/18/2022: ALT 35; B Natriuretic Peptide 599.1 12/21/2022: TSH 2.845 12/23/2022: Magnesium 2.4 12/24/2022: Hemoglobin 11.9; Platelets 247 01/05/2023: BUN 56; Creatinine, Ser 1.17; NT-Pro BNP 2,352; Potassium 4.0; Sodium 141  Recent Lipid Panel No results found for: "CHOL", "TRIG", "HDL", "CHOLHDL", "VLDL", "LDLCALC", "LDLDIRECT"   Risk Assessment/Calculations:    CHA2DS2-VASc Score = 6   This indicates a 9.7% annual risk of stroke. The patient's score is based upon: CHF History: 1 HTN History: 1 Diabetes History: 0 Stroke History: 0  Vascular Disease History: 1 (coronary/aortic atherosclerosis) Age Score: 2 Gender Score: 1            Physical Exam:    VS:  BP (!) 154/56 (BP Location: Left Arm, Patient Position: Sitting, Cuff Size: Small)   Pulse 76   Ht 5\' 3"  (1.6 m)   Wt 133 lb 6.4 oz (60.5 kg)   SpO2 96%   BMI 23.63 kg/m     Wt Readings from Last 3 Encounters:  02/16/23 133 lb 6.4 oz (60.5 kg)  01/05/23 136 lb (61.7 kg)  12/24/22 147 lb 7.8 oz (66.9 kg)     GEN: Appears younger than stated age,  well nourished, well developed in no acute distress HEENT: Normal NECK: No JVD; No carotid bruits LYMPHATICS: No lymphadenopathy CARDIAC:  irregularly irregular, no murmurs, rubs, gallops RESPIRATORY:  Clear to auscultation without rales, wheezing or rhonchi  ABDOMEN: Soft, non-tender, non-distended MUSCULOSKELETAL: No edema; No deformity  SKIN: Warm and dry NEUROLOGIC:  Alert and oriented x 3 PSYCHIATRIC:  Normal  affect   ASSESSMENT:    1. Persistent atrial fibrillation (HCC)   2. Chronic anticoagulation   3. Hypercoagulable state (HCC)   4. Heart failure with preserved ejection fraction, unspecified HF chronicity (HCC)     PLAN:    In order of problems listed above:  Chronic diastolic heart failure-NYHA class I, euvolemic, lung sounds clear.  She is getting Lasix 20 mg twice daily at her SNF, we will change this to be administered at 8 AM and 2 PM.  Will have them to weigh her daily and if she has a weight gain of 3 pounds per day or 5 pounds per week will have them give her an additional 40 mg of Lasix.  She is not on a beta-blocker, during her most recent hospitalization she had prolonged pauses and it was recommended that these were avoided.  She is very bothered by excessive thirst, will increase her fluid intake to 1500 mL/day.  Repeat BMET today. Persistent atrial fibrillation/hypercoagulable state/chronic anticoagulation-heart rate is controlled today at 76 bpm, CHA2DS2-VASc score of 6.  She is currently on reduced dose Eliquis at 2.5 mg twice daily per her primary cardiologist secondary to frailty, propensity to falling, current weight 133 pounds.  Denies hematochezia, hematuria, hemoptysis.  CBC on 12/18/2022 with stable H&H. CKD stage III-most recent GFR 43 careful titration of antihypertensives and diuretics.  For now, we will continue her Lasix 20 mg daily at 8 AM and 2 PM. Hyponatremia-most recent sodium 141 Hypertension-blood pressure today is slightly elevated however will not make any changes currently  Disposition-BMET today.  Change Lasix to be administered at 8 AM and 2 PM at facility.  Increase total fluid intake to 1500 mL/day.  Continue daily weights.  Return in 3 months.           Medication Adjustments/Labs and Tests Ordered: Current medicines are reviewed at length with the patient today.  Concerns regarding medicines are outlined above.  Orders Placed This Encounter   Procedures   Basic Metabolic Panel (BMET)    Meds ordered this encounter  Medications   furosemide (LASIX) 20 MG tablet    Sig: Take 1 tablet (20 mg total) by mouth 2 (two) times daily. Please give this medication at 8 am and 2 pm starting on 02/17/23    Dispense:  180 tablet    Refill:  3     Patient Instructions  Medication Instructions:  Your physician has recommended you make the  following change in your medication:   HOLD Lasix tonight START: Lasix 20 mg twice daily (Please give Lasix at 8 am and 2 pm starting on 02/17/23  *If you need a refill on your cardiac medications before your next appointment, please call your pharmacy*   Lab Work: Your physician recommends that you return for lab work in:   Labs today: BMP  If you have labs (blood work) drawn today and your tests are completely normal, you will receive your results only by: [employer] (if you have MyChart) OR A paper copy in the mail If you have any lab test that is abnormal or we need to change your treatment, we will call you to review the results.   Testing/Procedures: None   Follow-Up: At [facility], you and your health needs are our priority.  As part of our continuing mission to provide you with exceptional heart care, we have created designated Provider Care Teams.  These Care Teams include your primary Cardiologist (physician) and Advanced Practice Providers (APPs -  Physician Assistants and Nurse Practitioners) who all work together to provide you with the care you need, when you need it.  We recommend signing up for the patient portal called "MyChart".  Sign up information is provided on this After Visit Summary.  MyChart is used to connect with patients for Virtual Visits (Telemedicine).  Patients are able to view lab/test results, encounter notes, upcoming appointments, etc.  Non-urgent messages can be sent to your provider as well.   To learn more about what you can do with MyChart,  go to [email or web address].    Your next appointment:   3 month(s)  Provider:   Norman Herrlich, MD    Other Instructions Keep weighing daily  Increase fluid intake to per day    Signed, Flossie Dibble, NP  02/16/2023 3:40 PM    [facility]

## 2023-02-16 NOTE — Patient Instructions (Signed)
Medication Instructions:  Your physician has recommended you make the following change in your medication:   HOLD Lasix tonight START: Lasix 20 mg twice daily (Please give Lasix at 8 am and 2 pm starting on 02/17/23  *If you need a refill on your cardiac medications before your next appointment, please call your pharmacy*   Lab Work: Your physician recommends that you return for lab work in:   Labs today: BMP  If you have labs (blood work) drawn today and your tests are completely normal, you will receive your results only by: [employer] (if you have MyChart) OR A paper copy in the mail If you have any lab test that is abnormal or we need to change your treatment, we will call you to review the results.   Testing/Procedures: None   Follow-Up: At [facility], you and your health needs are our priority.  As part of our continuing mission to provide you with exceptional heart care, we have created designated Provider Care Teams.  These Care Teams include your primary Cardiologist (physician) and Advanced Practice Providers (APPs -  Physician Assistants and Nurse Practitioners) who all work together to provide you with the care you need, when you need it.  We recommend signing up for the patient portal called "MyChart".  Sign up information is provided on this After Visit Summary.  MyChart is used to connect with patients for Virtual Visits (Telemedicine).  Patients are able to view lab/test results, encounter notes, upcoming appointments, etc.  Non-urgent messages can be sent to your provider as well.   To learn more about what you can do with MyChart, go to [email or web address].    Your next appointment:   3 month(s)  Provider:   Norman Herrlich, MD    Other Instructions Keep weighing daily  Increase fluid intake to per day

## 2023-02-17 ENCOUNTER — Telehealth: Payer: Self-pay

## 2023-02-17 DIAGNOSIS — N183 Chronic kidney disease, stage 3 unspecified: Secondary | ICD-10-CM

## 2023-02-17 LAB — BASIC METABOLIC PANEL WITH GFR
BUN/Creatinine Ratio: 20 (ref 12–28)
BUN: 40 mg/dL — ABNORMAL HIGH (ref 10–36)
CO2: 27 mmol/L (ref 20–29)
Calcium: 9.3 mg/dL (ref 8.7–10.3)
Chloride: 98 mmol/L (ref 96–106)
Creatinine, Ser: 2.01 mg/dL — ABNORMAL HIGH (ref 0.57–1.00)
Glucose: 171 mg/dL — ABNORMAL HIGH (ref 70–99)
Potassium: 4.6 mmol/L (ref 3.5–5.2)
Sodium: 138 mmol/L (ref 134–144)
eGFR: 22 mL/min/1.73 — ABNORMAL LOW

## 2023-02-17 MED ORDER — FUROSEMIDE 20 MG PO TABS: 20.0000 mg | ORAL_TABLET | Freq: Every day | ORAL | 3 refills | Status: DC | PRN

## 2023-02-17 NOTE — Telephone Encounter (Signed)
See result note.  

## 2023-02-17 NOTE — Telephone Encounter (Signed)
Left vm for station 215 at [facility] to callback.  Spoke with Diane per DPR Results reviewed as per Wallis Bamberg PA's note.  Diane verbalized understanding and had no additional questions. Routed to PCP.

## 2023-02-17 NOTE — Telephone Encounter (Signed)
-----   Message from Flossie Dibble, NP sent at 02/17/2023  8:37 AM EDT ----- Please call her daughter and let her know that her sodium is fine but her kidney function is worse. We need to stop her lasix. Have the facility continue to weight her daily and administer the lasix if she has gained 2 lbs over night or if her ankles are swollen. Need repeat BMET in 1 week, fine for her to have it at the facility but we need them to fax [place] the results.

## 2023-02-17 NOTE — Telephone Encounter (Signed)
-----   Message from Jennifer C Woody, NP sent at 02/17/2023  8:37 AM EDT ----- Please call her daughter and let her know that her sodium is fine but her kidney function is worse. We need to stop her lasix. Have the facility continue to weight her daily and administer the lasix if she has gained 2 lbs over night or if her ankles are swollen. Need repeat BMET in 1 week, fine for her to have it at the facility but we need them to fax us the results.  

## 2023-02-24 DIAGNOSIS — I509 Heart failure, unspecified: Secondary | ICD-10-CM | POA: Diagnosis not present

## 2023-03-04 DIAGNOSIS — I10 Essential (primary) hypertension: Secondary | ICD-10-CM | POA: Diagnosis not present

## 2023-03-04 DIAGNOSIS — R059 Cough, unspecified: Secondary | ICD-10-CM | POA: Diagnosis not present

## 2023-03-05 DIAGNOSIS — I119 Hypertensive heart disease without heart failure: Secondary | ICD-10-CM | POA: Diagnosis not present

## 2023-03-05 DIAGNOSIS — I5032 Chronic diastolic (congestive) heart failure: Secondary | ICD-10-CM | POA: Diagnosis not present

## 2023-03-29 DIAGNOSIS — I10 Essential (primary) hypertension: Secondary | ICD-10-CM | POA: Diagnosis not present

## 2023-03-30 DIAGNOSIS — L570 Actinic keratosis: Secondary | ICD-10-CM | POA: Diagnosis not present

## 2023-04-06 DIAGNOSIS — N1831 Chronic kidney disease, stage 3a: Secondary | ICD-10-CM | POA: Diagnosis not present

## 2023-04-06 DIAGNOSIS — D649 Anemia, unspecified: Secondary | ICD-10-CM | POA: Diagnosis not present

## 2023-04-06 DIAGNOSIS — I5032 Chronic diastolic (congestive) heart failure: Secondary | ICD-10-CM | POA: Diagnosis not present

## 2023-04-06 DIAGNOSIS — I4891 Unspecified atrial fibrillation: Secondary | ICD-10-CM | POA: Diagnosis not present

## 2023-04-06 DIAGNOSIS — M810 Age-related osteoporosis without current pathological fracture: Secondary | ICD-10-CM | POA: Diagnosis not present

## 2023-04-06 DIAGNOSIS — I119 Hypertensive heart disease without heart failure: Secondary | ICD-10-CM | POA: Diagnosis not present

## 2023-04-29 DIAGNOSIS — Z7952 Long term (current) use of systemic steroids: Secondary | ICD-10-CM | POA: Diagnosis not present

## 2023-04-29 DIAGNOSIS — E119 Type 2 diabetes mellitus without complications: Secondary | ICD-10-CM | POA: Diagnosis not present

## 2023-04-29 DIAGNOSIS — Z961 Presence of intraocular lens: Secondary | ICD-10-CM | POA: Diagnosis not present

## 2023-05-05 DIAGNOSIS — N1831 Chronic kidney disease, stage 3a: Secondary | ICD-10-CM | POA: Diagnosis not present

## 2023-05-05 DIAGNOSIS — M810 Age-related osteoporosis without current pathological fracture: Secondary | ICD-10-CM | POA: Diagnosis not present

## 2023-05-05 DIAGNOSIS — E871 Hypo-osmolality and hyponatremia: Secondary | ICD-10-CM | POA: Diagnosis not present

## 2023-05-05 DIAGNOSIS — I5032 Chronic diastolic (congestive) heart failure: Secondary | ICD-10-CM | POA: Diagnosis not present

## 2023-05-05 DIAGNOSIS — I5023 Acute on chronic systolic (congestive) heart failure: Secondary | ICD-10-CM | POA: Diagnosis not present

## 2023-05-05 DIAGNOSIS — I119 Hypertensive heart disease without heart failure: Secondary | ICD-10-CM | POA: Diagnosis not present

## 2023-05-05 DIAGNOSIS — I4891 Unspecified atrial fibrillation: Secondary | ICD-10-CM | POA: Diagnosis not present

## 2023-05-05 DIAGNOSIS — D649 Anemia, unspecified: Secondary | ICD-10-CM | POA: Diagnosis not present

## 2023-05-05 DIAGNOSIS — E441 Mild protein-calorie malnutrition: Secondary | ICD-10-CM | POA: Diagnosis not present

## 2023-05-05 DIAGNOSIS — R5381 Other malaise: Secondary | ICD-10-CM | POA: Diagnosis not present

## 2023-05-05 DIAGNOSIS — I1310 Hypertensive heart and chronic kidney disease without heart failure, with stage 1 through stage 4 chronic kidney disease, or unspecified chronic kidney disease: Secondary | ICD-10-CM | POA: Diagnosis not present

## 2023-05-12 DIAGNOSIS — L602 Onychogryphosis: Secondary | ICD-10-CM | POA: Diagnosis not present

## 2023-05-12 DIAGNOSIS — E1151 Type 2 diabetes mellitus with diabetic peripheral angiopathy without gangrene: Secondary | ICD-10-CM | POA: Diagnosis not present

## 2023-05-12 DIAGNOSIS — L603 Nail dystrophy: Secondary | ICD-10-CM | POA: Diagnosis not present

## 2023-05-12 DIAGNOSIS — L84 Corns and callosities: Secondary | ICD-10-CM | POA: Diagnosis not present

## 2023-05-20 DIAGNOSIS — H00015 Hordeolum externum left lower eyelid: Secondary | ICD-10-CM | POA: Diagnosis not present

## 2023-05-26 ENCOUNTER — Ambulatory Visit: Payer: Medicare Other | Attending: Cardiology | Admitting: Cardiology

## 2023-05-26 ENCOUNTER — Encounter: Payer: Self-pay | Admitting: Cardiology

## 2023-05-26 VITALS — BP 140/80 | HR 98 | Ht 63.0 in | Wt 137.0 lb

## 2023-05-26 DIAGNOSIS — N183 Chronic kidney disease, stage 3 unspecified: Secondary | ICD-10-CM | POA: Diagnosis not present

## 2023-05-26 DIAGNOSIS — E871 Hypo-osmolality and hyponatremia: Secondary | ICD-10-CM

## 2023-05-26 DIAGNOSIS — I110 Hypertensive heart disease with heart failure: Secondary | ICD-10-CM | POA: Diagnosis not present

## 2023-05-26 DIAGNOSIS — I4819 Other persistent atrial fibrillation: Secondary | ICD-10-CM

## 2023-05-26 DIAGNOSIS — I5032 Chronic diastolic (congestive) heart failure: Secondary | ICD-10-CM | POA: Diagnosis not present

## 2023-05-26 DIAGNOSIS — Z7901 Long term (current) use of anticoagulants: Secondary | ICD-10-CM | POA: Diagnosis not present

## 2023-05-26 NOTE — Progress Notes (Signed)
Cardiology Office Note:    Date:  05/26/2023   ID:  Karina Chase, DOB March 18, 1925, MRN [id number]  PCP:  Noberto Retort, MD  Cardiologist:  Norman Herrlich, MD    Referring MD: Noberto Retort, MD    ASSESSMENT:    1. Persistent atrial fibrillation (HCC)   2. Chronic anticoagulation   3. Hypertensive heart disease with chronic diastolic congestive heart failure (HCC)   4. Stage 3 chronic kidney disease, unspecified whether stage 3a or 3b CKD (HCC)   5. Hyponatremia    PLAN:    In order of problems listed above:  Rate is controlled withoutsuppressant therapy and continue her current reduced dose anticoagulant Stable I think her edema is related to increased sodium intake she is taking much higher dose of diuretic as well as a calcium channel blocker hypertension been difficult to control and I would not change her medications to give her a higher dose of diuretic with her CKD and hyponatremia I advised them to him use Mucinex for chronic sputum production  Next appointment: 6 months   Medication Adjustments/Labs and Tests Ordered: Current medicines are reviewed at length with the patient today.  Concerns regarding medicines are outlined above.  No orders of the defined types were placed in this encounter.  No orders of the defined types were placed in this encounter.    History of Present Illness:    Karina Chase is a 89 y.o. female with a hx of persistent atrial fibrillation with rate control and chronic anticoagulation hypertensive heart disease with chronic diastolic heart failure and stage III CKD last seen 10/14/2022.  She is here with her daughter participates in evaluation decision making She is transition to assisted living  in [place] With sharing food increase sodium intake she is more edematous weight is up and she is taking a much higher dose of diuretic She has no orthopnea shortness of breath chest pain palpitation or syncope She is a chronic cough  with sputum production she uses a bronchodilator I advised him that Mucinex could be helpful I do not think that her heart failure is decompensated She has had no bleeding complication from her anticoagulant  Compliance with diet, lifestyle and medications: Yes Past Medical History:  Diagnosis Date   Arthritis    Asthma    Breast cancer (HCC)    Cancer (HCC)    Basal cell skin cancer   Chronic diastolic CHF (congestive heart failure) (HCC)    CKD stage 3b, GFR 30-44 ml/min (HCC)    Colon polyp    GERD (gastroesophageal reflux disease)    History of shingles    Hypertension    Hypertensive heart disease 05/30/2020   Persistent atrial fibrillation (HCC)    Pulmonary hypertension (HCC)    Right groin pain 03/28/2013   Stress incontinence    Tricuspid regurgitation    Varicose veins    Vertigo     Current Medications: Current Meds  Medication Sig   albuterol (VENTOLIN HFA) 108 (90 Base) MCG/ACT inhaler Inhale 1-2 puffs into the lungs every 6 (six) hours as needed for wheezing or shortness of breath.   amLODipine (NORVASC) 10 MG tablet Take 1 tablet (10 mg total) by mouth daily.   Cholecalciferol (VITAMIN D-3) 125 MCG (5000 UT) TABS Take 1 tablet by mouth daily.   cyanocobalamin (VITAMIN B12) 1000 MCG tablet Take 1,000 mcg by mouth daily.   DULERA 200-5 MCG/ACT AERO Inhale 2 puffs into the lungs 2 (two) times daily.  ELIQUIS 2.5 MG TABS tablet TAKE ONE TABLET BY MOUTH TWICE DAILY   furosemide (LASIX) 40 MG tablet Take 40 mg by mouth 2 (two) times daily.   magnesium hydroxide (MILK OF MAGNESIA) 400 MG/5ML suspension Take 30 mLs by mouth daily as needed for mild constipation.   nitroGLYCERIN (NITROSTAT) 0.4 MG SL tablet Place 1 tablet (0.4 mg total) under the tongue every 5 (five) minutes as needed for chest pain.   ondansetron (ZOFRAN-ODT) 4 MG disintegrating tablet Take 4 mg by mouth every 8 (eight) hours.   polyethylene glycol (MIRALAX / GLYCOLAX) 17 g packet Take 17 g by mouth  daily as needed.   senna (SENOKOT) 8.6 MG tablet Take 2 tablets by mouth at bedtime.   urea (URE-NA) 15 g PACK oral packet Take 15 g by mouth 2 (two) times daily.   Current Facility-Administered Medications for the 05/26/23 encounter (Office Visit) with Baldo Daub, MD  Medication   triamcinolone acetonide (KENALOG) 10 MG/ML injection 10 mg      EKGs/Labs/Other Studies Reviewed:    The following studies were reviewed today:  Cardiac Studies & Procedures       ECHOCARDIOGRAM  ECHOCARDIOGRAM COMPLETE 12/19/2022  Narrative ECHOCARDIOGRAM REPORT    Patient Name:   Karina Chase Date of Exam: 12/19/2022 Medical Rec #:  [id number]        Height:       63.0 in Accession #:    [id number]       Weight:       154.3 lb Date of Birth:  [birth year]        BSA:          1.732 m Patient Age:    89 years         BP:           155/77 mmHg Patient Gender: F                HR:           81 bpm. Exam Location:  Inpatient  Procedure: 2D Echo, Cardiac Doppler and Color Doppler  Indications:    A flutter  History:        Patient has prior history of Echocardiogram examinations, most recent 06/20/2020. CHF, COPD, Signs/Symptoms:Chest Pain; Risk Factors:Hypertension.  Sonographer:    Melissa Morford RDCS (AE, PE) Referring Phys: [id number] JARED M GARDNER  IMPRESSIONS   1. Left ventricular ejection fraction, by estimation, is 60 to 65%. The left ventricle has normal function. The left ventricle has no regional wall motion abnormalities. There is mild left ventricular hypertrophy. Left ventricular diastolic function could not be evaluated. 2. Right ventricular systolic function is mildly reduced. The right ventricular size is normal. There is moderately elevated pulmonary artery systolic pressure. The estimated right ventricular systolic pressure is 53.4 mmHg. 3. Left atrial size was mildly dilated. 4. Right atrial size was moderately dilated. 5. The mitral valve is normal in structure. Mild  mitral valve regurgitation. No evidence of mitral stenosis. 6. The tricuspid valve is abnormal. Tricuspid valve regurgitation is moderate. 7. The aortic valve is tricuspid. Aortic valve regurgitation is not visualized. No aortic stenosis is present. 8. The inferior vena cava is normal in size with <50% respiratory variability, suggesting right atrial pressure of 8 mmHg.  Comparison(s): No significant change from prior study.  FINDINGS Left Ventricle: Left ventricular ejection fraction, by estimation, is 60 to 65%. The left ventricle has normal function. The left ventricle has no regional wall motion abnormalities.  The left ventricular internal cavity size was normal in size. There is mild left ventricular hypertrophy. Left ventricular diastolic function could not be evaluated due to atrial fibrillation. Left ventricular diastolic function could not be evaluated.  Right Ventricle: The right ventricular size is normal. No increase in right ventricular wall thickness. Right ventricular systolic function is mildly reduced. There is moderately elevated pulmonary artery systolic pressure. The tricuspid regurgitant velocity is 3.37 m/s, and with an assumed right atrial pressure of 8 mmHg, the estimated right ventricular systolic pressure is 53.4 mmHg.  Left Atrium: Left atrial size was mildly dilated.  Right Atrium: Right atrial size was moderately dilated.  Pericardium: There is no evidence of pericardial effusion.  Mitral Valve: The mitral valve is normal in structure. Mild mitral valve regurgitation. No evidence of mitral valve stenosis.  Tricuspid Valve: The tricuspid valve is abnormal. Tricuspid valve regurgitation is moderate . No evidence of tricuspid stenosis.  Aortic Valve: The aortic valve is tricuspid. Aortic valve regurgitation is not visualized. No aortic stenosis is present.  Pulmonic Valve: The pulmonic valve was normal in structure. Pulmonic valve regurgitation is not visualized.  No evidence of pulmonic stenosis.  Aorta: The aortic root and ascending aorta are structurally normal, with no evidence of dilitation.  Venous: The inferior vena cava is normal in size with less than 50% respiratory variability, suggesting right atrial pressure of 8 mmHg.  IAS/Shunts: No atrial level shunt detected by color flow Doppler.   LEFT VENTRICLE PLAX 2D LVIDd:         3.80 cm LVIDs:         2.80 cm LV PW:         1.20 cm LV IVS:        1.10 cm LVOT diam:     1.90 cm LV SV:         47 LV SV Index:   27 LVOT Area:     2.84 cm  LV Volumes (MOD) LV vol d, MOD A2C: 84.7 ml LV vol d, MOD A4C: 58.5 ml LV vol s, MOD A2C: 35.0 ml LV vol s, MOD A4C: 35.2 ml LV SV MOD A2C:     49.7 ml LV SV MOD A4C:     58.5 ml LV SV MOD BP:      34.5 ml  RIGHT VENTRICLE RV S prime:     9.90 cm/s TAPSE (M-mode): 1.6 cm  LEFT ATRIUM             Index        RIGHT ATRIUM           Index LA diam:        4.10 cm 2.37 cm/m   RA Area:     18.00 cm LA Vol (A2C):   81.8 ml 47.23 ml/m  RA Volume:   47.30 ml  27.31 ml/m LA Vol (A4C):   49.0 ml 28.29 ml/m LA Biplane Vol: 63.3 ml 36.55 ml/m AORTIC VALVE LVOT Vmax:   81.30 cm/s LVOT Vmean:  64.600 cm/s LVOT VTI:    0.165 m  AORTA Ao Root diam: 3.40 cm Ao Asc diam:  3.40 cm  MITRAL VALVE                TRICUSPID VALVE MV Area (PHT): 4.33 cm     TR Peak grad:   45.4 mmHg MV Decel Time: 175 msec     TR Vmax:        337.00 cm/s MV E velocity: 112.00  cm/s SHUNTS Systemic VTI:  0.16 m Systemic Diam: 1.90 cm  Vishnu Priya Mallipeddi Electronically signed by Winfield Rast Mallipeddi Signature Date/Time: 12/19/2022/3:32:05 PM    Final    MONITORS  LONG TERM MONITOR (3-14 DAYS) 10/24/2020  Narrative Patch Wear Time:  3 days and 4 hours (2022-02-08T13:24:08-499 to 2022-02-11T18:15:24-0500)  Atrial Fibrillation occurred continuously (100% burden), ranging from 39-162 bpm (avg of 70 bpm). Isolated VEs were rare (<1.0%), VE Couplets  were rare (<1.0%), and no VE Triplets were present.  An event monitor was performed for 3 days and 4 hours assess heart rate response to atrial fibrillation. The cardiac rhythm throughout was atrial fibrillation with average rate of 70 minimum 39 maximum rate of 162 bpm. In general heart rate was well controlled in range 50 to 110 bpm 98% of the time today 97% of the time night and less than 1% greater than 150 bpm. There were no pauses of 3 seconds or greater. Ventricular ectopy was rare with isolated PVCs and couplets. There were no triggered or diary events.   Conclusion: atrial fibrillation with good heart rate control.               Recent Labs: 12/18/2022: ALT 35; B Natriuretic Peptide 599.1 12/21/2022: TSH 2.845 12/23/2022: Magnesium 2.4 12/24/2022: Hemoglobin 11.9; Platelets 247 01/05/2023: NT-Pro BNP 2,352 02/16/2023: BUN 40; Creatinine, Ser 2.01; Potassium 4.6; Sodium 138  Recent Lipid Panel No results found for: "CHOL", "TRIG", "HDL", "CHOLHDL", "VLDL", "LDLCALC", "LDLDIRECT"  Physical Exam:    VS:  BP (!) 140/80 (BP Location: Left Arm, Patient Position: Sitting, Cuff Size: Normal)   Pulse 98   Ht 5\' 3"  (1.6 m)   Wt 137 lb (62.1 kg)   SpO2 98%   BMI 24.27 kg/m     Wt Readings from Last 3 Encounters:  05/26/23 137 lb (62.1 kg)  02/16/23 133 lb 6.4 oz (60.5 kg)  01/05/23 136 lb (61.7 kg)     GEN:  Well nourished, well developed in no acute distress HEENT: Normal NECK: No JVD; No carotid bruits LYMPHATICS: No lymphadenopathy CARDIAC: RRR, no murmurs, rubs, gallops RESPIRATORY:  Clear to auscultation without rales, wheezing or rhonchi  ABDOMEN: Soft, non-tender, non-distended MUSCULOSKELETAL: Bilateral lower extremity edema she does take a calcium channel blocker edema; No deformity  SKIN: Warm and dry NEUROLOGIC:  Alert and oriented x 3 PSYCHIATRIC:  Normal affect    Signed, Norman Herrlich, MD  05/26/2023 11:26 AM    [facility]

## 2023-05-26 NOTE — Patient Instructions (Signed)
Medication Instructions:  Your physician recommends that you continue on your current medications as directed. Please refer to the Current Medication list given to you today.  *If you need a refill on your cardiac medications before your next appointment, please call your pharmacy*   Lab Work: None If you have labs (blood work) drawn today and your tests are completely normal, you will receive your results only by: MyChart Message (if you have MyChart) OR A paper copy in the mail If you have any lab test that is abnormal or we need to change your treatment, we will call you to review the results.   Testing/Procedures: None   Follow-Up: At [facility], you and your health needs are our priority.  As part of our continuing mission to provide you with exceptional heart care, we have created designated Provider Care Teams.  These Care Teams include your primary Cardiologist (physician) and Advanced Practice Providers (APPs -  Physician Assistants and Nurse Practitioners) who all work together to provide you with the care you need, when you need it.  We recommend signing up for the patient portal called "MyChart".  Sign up information is provided on this After Visit Summary.  MyChart is used to connect with patients for Virtual Visits (Telemedicine).  Patients are able to view lab/test results, encounter notes, upcoming appointments, etc.  Non-urgent messages can be sent to your provider as well.   To learn more about what you can do with MyChart, go to [email or web address].    Your next appointment:   6 month(s)  Provider:   Brian Munley, MD    Other Instructions None  

## 2023-06-05 DIAGNOSIS — K219 Gastro-esophageal reflux disease without esophagitis: Secondary | ICD-10-CM | POA: Diagnosis not present

## 2023-06-05 DIAGNOSIS — I5023 Acute on chronic systolic (congestive) heart failure: Secondary | ICD-10-CM | POA: Diagnosis not present

## 2023-06-05 DIAGNOSIS — M810 Age-related osteoporosis without current pathological fracture: Secondary | ICD-10-CM | POA: Diagnosis not present

## 2023-06-05 DIAGNOSIS — I1310 Hypertensive heart and chronic kidney disease without heart failure, with stage 1 through stage 4 chronic kidney disease, or unspecified chronic kidney disease: Secondary | ICD-10-CM | POA: Diagnosis not present

## 2023-06-05 DIAGNOSIS — E871 Hypo-osmolality and hyponatremia: Secondary | ICD-10-CM | POA: Diagnosis not present

## 2023-06-05 DIAGNOSIS — N1831 Chronic kidney disease, stage 3a: Secondary | ICD-10-CM | POA: Diagnosis not present

## 2023-06-05 DIAGNOSIS — I4891 Unspecified atrial fibrillation: Secondary | ICD-10-CM | POA: Diagnosis not present

## 2023-06-05 DIAGNOSIS — D649 Anemia, unspecified: Secondary | ICD-10-CM | POA: Diagnosis not present

## 2023-06-05 DIAGNOSIS — M2550 Pain in unspecified joint: Secondary | ICD-10-CM | POA: Diagnosis not present

## 2023-06-06 DIAGNOSIS — I1310 Hypertensive heart and chronic kidney disease without heart failure, with stage 1 through stage 4 chronic kidney disease, or unspecified chronic kidney disease: Secondary | ICD-10-CM | POA: Diagnosis not present

## 2023-06-06 DIAGNOSIS — I4891 Unspecified atrial fibrillation: Secondary | ICD-10-CM | POA: Diagnosis not present

## 2023-06-18 DIAGNOSIS — D225 Melanocytic nevi of trunk: Secondary | ICD-10-CM | POA: Diagnosis not present

## 2023-06-18 DIAGNOSIS — L820 Inflamed seborrheic keratosis: Secondary | ICD-10-CM | POA: Diagnosis not present

## 2023-06-18 DIAGNOSIS — D485 Neoplasm of uncertain behavior of skin: Secondary | ICD-10-CM | POA: Diagnosis not present

## 2023-06-18 DIAGNOSIS — D2239 Melanocytic nevi of other parts of face: Secondary | ICD-10-CM | POA: Diagnosis not present

## 2023-06-18 DIAGNOSIS — L300 Nummular dermatitis: Secondary | ICD-10-CM | POA: Diagnosis not present

## 2023-06-24 DIAGNOSIS — M25571 Pain in right ankle and joints of right foot: Secondary | ICD-10-CM | POA: Diagnosis not present

## 2023-06-24 DIAGNOSIS — M79671 Pain in right foot: Secondary | ICD-10-CM | POA: Diagnosis not present

## 2023-06-26 DIAGNOSIS — Z79899 Other long term (current) drug therapy: Secondary | ICD-10-CM | POA: Diagnosis not present

## 2023-07-05 DIAGNOSIS — I4891 Unspecified atrial fibrillation: Secondary | ICD-10-CM | POA: Diagnosis not present

## 2023-07-05 DIAGNOSIS — I5023 Acute on chronic systolic (congestive) heart failure: Secondary | ICD-10-CM | POA: Diagnosis not present

## 2023-07-22 DIAGNOSIS — D649 Anemia, unspecified: Secondary | ICD-10-CM | POA: Diagnosis not present

## 2023-07-22 DIAGNOSIS — E871 Hypo-osmolality and hyponatremia: Secondary | ICD-10-CM | POA: Diagnosis not present

## 2023-07-22 DIAGNOSIS — I5032 Chronic diastolic (congestive) heart failure: Secondary | ICD-10-CM | POA: Diagnosis not present

## 2023-07-22 DIAGNOSIS — N183 Chronic kidney disease, stage 3 unspecified: Secondary | ICD-10-CM | POA: Diagnosis not present

## 2023-07-22 DIAGNOSIS — I129 Hypertensive chronic kidney disease with stage 1 through stage 4 chronic kidney disease, or unspecified chronic kidney disease: Secondary | ICD-10-CM | POA: Diagnosis not present

## 2023-07-26 DIAGNOSIS — L602 Onychogryphosis: Secondary | ICD-10-CM | POA: Diagnosis not present

## 2023-07-26 DIAGNOSIS — E1151 Type 2 diabetes mellitus with diabetic peripheral angiopathy without gangrene: Secondary | ICD-10-CM | POA: Diagnosis not present

## 2023-07-26 DIAGNOSIS — L603 Nail dystrophy: Secondary | ICD-10-CM | POA: Diagnosis not present

## 2023-07-26 DIAGNOSIS — L84 Corns and callosities: Secondary | ICD-10-CM | POA: Diagnosis not present

## 2023-07-28 DIAGNOSIS — C44319 Basal cell carcinoma of skin of other parts of face: Secondary | ICD-10-CM | POA: Diagnosis not present

## 2023-07-28 DIAGNOSIS — C44329 Squamous cell carcinoma of skin of other parts of face: Secondary | ICD-10-CM | POA: Diagnosis not present

## 2023-08-06 DIAGNOSIS — N1831 Chronic kidney disease, stage 3a: Secondary | ICD-10-CM | POA: Diagnosis not present

## 2023-08-06 DIAGNOSIS — I4891 Unspecified atrial fibrillation: Secondary | ICD-10-CM | POA: Diagnosis not present

## 2023-08-06 DIAGNOSIS — I119 Hypertensive heart disease without heart failure: Secondary | ICD-10-CM | POA: Diagnosis not present

## 2023-08-06 DIAGNOSIS — E441 Mild protein-calorie malnutrition: Secondary | ICD-10-CM | POA: Diagnosis not present

## 2023-08-06 DIAGNOSIS — M2550 Pain in unspecified joint: Secondary | ICD-10-CM | POA: Diagnosis not present

## 2023-08-06 DIAGNOSIS — I5023 Acute on chronic systolic (congestive) heart failure: Secondary | ICD-10-CM | POA: Diagnosis not present

## 2023-08-06 DIAGNOSIS — I1310 Hypertensive heart and chronic kidney disease without heart failure, with stage 1 through stage 4 chronic kidney disease, or unspecified chronic kidney disease: Secondary | ICD-10-CM | POA: Diagnosis not present

## 2023-08-06 DIAGNOSIS — R5381 Other malaise: Secondary | ICD-10-CM | POA: Diagnosis not present

## 2023-08-10 DIAGNOSIS — H903 Sensorineural hearing loss, bilateral: Secondary | ICD-10-CM | POA: Diagnosis not present

## 2023-08-27 DIAGNOSIS — I10 Essential (primary) hypertension: Secondary | ICD-10-CM | POA: Diagnosis not present

## 2023-08-27 DIAGNOSIS — I509 Heart failure, unspecified: Secondary | ICD-10-CM | POA: Diagnosis not present

## 2023-09-06 DIAGNOSIS — E441 Mild protein-calorie malnutrition: Secondary | ICD-10-CM | POA: Diagnosis not present

## 2023-09-06 DIAGNOSIS — N1831 Chronic kidney disease, stage 3a: Secondary | ICD-10-CM | POA: Diagnosis not present

## 2023-10-01 DIAGNOSIS — D649 Anemia, unspecified: Secondary | ICD-10-CM | POA: Diagnosis not present

## 2023-10-06 DIAGNOSIS — I4891 Unspecified atrial fibrillation: Secondary | ICD-10-CM | POA: Diagnosis not present

## 2023-10-06 DIAGNOSIS — N1831 Chronic kidney disease, stage 3a: Secondary | ICD-10-CM | POA: Diagnosis not present

## 2023-10-06 DIAGNOSIS — D649 Anemia, unspecified: Secondary | ICD-10-CM | POA: Diagnosis not present

## 2023-10-06 DIAGNOSIS — M2550 Pain in unspecified joint: Secondary | ICD-10-CM | POA: Diagnosis not present

## 2023-10-06 DIAGNOSIS — I5032 Chronic diastolic (congestive) heart failure: Secondary | ICD-10-CM | POA: Diagnosis not present

## 2023-10-06 DIAGNOSIS — M810 Age-related osteoporosis without current pathological fracture: Secondary | ICD-10-CM | POA: Diagnosis not present

## 2023-10-06 DIAGNOSIS — I119 Hypertensive heart disease without heart failure: Secondary | ICD-10-CM | POA: Diagnosis not present

## 2023-10-06 DIAGNOSIS — E441 Mild protein-calorie malnutrition: Secondary | ICD-10-CM | POA: Diagnosis not present

## 2023-10-14 DIAGNOSIS — L602 Onychogryphosis: Secondary | ICD-10-CM | POA: Diagnosis not present

## 2023-10-14 DIAGNOSIS — E1151 Type 2 diabetes mellitus with diabetic peripheral angiopathy without gangrene: Secondary | ICD-10-CM | POA: Diagnosis not present

## 2023-10-14 DIAGNOSIS — L603 Nail dystrophy: Secondary | ICD-10-CM | POA: Diagnosis not present

## 2023-10-14 DIAGNOSIS — L84 Corns and callosities: Secondary | ICD-10-CM | POA: Diagnosis not present

## 2023-11-05 DIAGNOSIS — N1831 Chronic kidney disease, stage 3a: Secondary | ICD-10-CM | POA: Diagnosis not present

## 2023-11-05 DIAGNOSIS — D649 Anemia, unspecified: Secondary | ICD-10-CM | POA: Diagnosis not present

## 2023-11-12 DIAGNOSIS — E0590 Thyrotoxicosis, unspecified without thyrotoxic crisis or storm: Secondary | ICD-10-CM | POA: Diagnosis not present

## 2023-11-17 DIAGNOSIS — E871 Hypo-osmolality and hyponatremia: Secondary | ICD-10-CM | POA: Diagnosis not present

## 2023-11-17 DIAGNOSIS — I129 Hypertensive chronic kidney disease with stage 1 through stage 4 chronic kidney disease, or unspecified chronic kidney disease: Secondary | ICD-10-CM | POA: Diagnosis not present

## 2023-11-17 DIAGNOSIS — N1832 Chronic kidney disease, stage 3b: Secondary | ICD-10-CM | POA: Diagnosis not present

## 2023-11-17 DIAGNOSIS — I5032 Chronic diastolic (congestive) heart failure: Secondary | ICD-10-CM | POA: Diagnosis not present

## 2023-11-17 DIAGNOSIS — D649 Anemia, unspecified: Secondary | ICD-10-CM | POA: Diagnosis not present

## 2023-11-17 LAB — LAB REPORT - SCANNED: EGFR: 40

## 2023-12-04 DIAGNOSIS — M549 Dorsalgia, unspecified: Secondary | ICD-10-CM | POA: Diagnosis not present

## 2023-12-04 DIAGNOSIS — M810 Age-related osteoporosis without current pathological fracture: Secondary | ICD-10-CM | POA: Diagnosis not present

## 2023-12-04 DIAGNOSIS — I4891 Unspecified atrial fibrillation: Secondary | ICD-10-CM | POA: Diagnosis not present

## 2023-12-04 DIAGNOSIS — E871 Hypo-osmolality and hyponatremia: Secondary | ICD-10-CM | POA: Diagnosis not present

## 2023-12-04 DIAGNOSIS — I1310 Hypertensive heart and chronic kidney disease without heart failure, with stage 1 through stage 4 chronic kidney disease, or unspecified chronic kidney disease: Secondary | ICD-10-CM | POA: Diagnosis not present

## 2023-12-04 DIAGNOSIS — D649 Anemia, unspecified: Secondary | ICD-10-CM | POA: Diagnosis not present

## 2023-12-04 DIAGNOSIS — E441 Mild protein-calorie malnutrition: Secondary | ICD-10-CM | POA: Diagnosis not present

## 2023-12-04 DIAGNOSIS — I5032 Chronic diastolic (congestive) heart failure: Secondary | ICD-10-CM | POA: Diagnosis not present

## 2023-12-14 ENCOUNTER — Ambulatory Visit: Payer: Medicare Other | Admitting: Cardiology

## 2023-12-20 DIAGNOSIS — L602 Onychogryphosis: Secondary | ICD-10-CM | POA: Diagnosis not present

## 2023-12-20 DIAGNOSIS — E1151 Type 2 diabetes mellitus with diabetic peripheral angiopathy without gangrene: Secondary | ICD-10-CM | POA: Diagnosis not present

## 2023-12-20 DIAGNOSIS — L603 Nail dystrophy: Secondary | ICD-10-CM | POA: Diagnosis not present

## 2023-12-22 DIAGNOSIS — L570 Actinic keratosis: Secondary | ICD-10-CM | POA: Diagnosis not present

## 2023-12-22 DIAGNOSIS — R233 Spontaneous ecchymoses: Secondary | ICD-10-CM | POA: Diagnosis not present

## 2023-12-22 DIAGNOSIS — L814 Other melanin hyperpigmentation: Secondary | ICD-10-CM | POA: Diagnosis not present

## 2023-12-22 DIAGNOSIS — L821 Other seborrheic keratosis: Secondary | ICD-10-CM | POA: Diagnosis not present

## 2024-01-03 DIAGNOSIS — I1310 Hypertensive heart and chronic kidney disease without heart failure, with stage 1 through stage 4 chronic kidney disease, or unspecified chronic kidney disease: Secondary | ICD-10-CM | POA: Diagnosis not present

## 2024-01-03 DIAGNOSIS — I4891 Unspecified atrial fibrillation: Secondary | ICD-10-CM | POA: Diagnosis not present

## 2024-01-12 ENCOUNTER — Encounter: Payer: Self-pay | Admitting: Cardiology

## 2024-01-12 DIAGNOSIS — S81801A Unspecified open wound, right lower leg, initial encounter: Secondary | ICD-10-CM | POA: Diagnosis not present

## 2024-01-13 ENCOUNTER — Ambulatory Visit: Admitting: Cardiology

## 2024-01-19 DIAGNOSIS — S81801A Unspecified open wound, right lower leg, initial encounter: Secondary | ICD-10-CM | POA: Diagnosis not present

## 2024-01-24 DIAGNOSIS — R059 Cough, unspecified: Secondary | ICD-10-CM | POA: Diagnosis not present

## 2024-01-27 DIAGNOSIS — S81801A Unspecified open wound, right lower leg, initial encounter: Secondary | ICD-10-CM | POA: Diagnosis not present

## 2024-02-02 DIAGNOSIS — S81801A Unspecified open wound, right lower leg, initial encounter: Secondary | ICD-10-CM | POA: Diagnosis not present

## 2024-02-07 LAB — LAB REPORT - SCANNED: EGFR: 37

## 2024-02-11 LAB — LAB REPORT - SCANNED
EGFR: 40
PTH: 57.6

## 2024-03-08 ENCOUNTER — Ambulatory Visit

## 2024-03-08 VITALS — BP 142/60 | HR 61 | Ht 63.0 in | Wt 128.8 lb

## 2024-03-08 DIAGNOSIS — I5032 Chronic diastolic (congestive) heart failure: Secondary | ICD-10-CM

## 2024-03-08 DIAGNOSIS — I110 Hypertensive heart disease with heart failure: Secondary | ICD-10-CM | POA: Diagnosis not present

## 2024-03-08 DIAGNOSIS — I4819 Other persistent atrial fibrillation: Secondary | ICD-10-CM | POA: Diagnosis not present

## 2024-03-08 NOTE — Patient Instructions (Signed)
Medication Instructions:  Your physician recommends that you continue on your current medications as directed. Please refer to the Current Medication list given to you today.  *If you need a refill on your cardiac medications before your next appointment, please call your pharmacy*   Lab Work: None ordered If you have labs (blood work) drawn today and your tests are completely normal, you will receive your results only by: MyChart Message (if you have MyChart) OR A paper copy in the mail If you have any lab test that is abnormal or we need to change your treatment, we will call you to review the results.   Testing/Procedures: None ordered   Follow-Up: At [facility], you and your health needs are our priority.  As part of our continuing mission to provide you with exceptional heart care, we have created designated Provider Care Teams.  These Care Teams include your primary Cardiologist (physician) and Advanced Practice Providers (APPs -  Physician Assistants and Nurse Practitioners) who all work together to provide you with the care you need, when you need it.  We recommend signing up for the patient portal called "MyChart".  Sign up information is provided on this After Visit Summary.  MyChart is used to connect with patients for Virtual Visits (Telemedicine).  Patients are able to view lab/test results, encounter notes, upcoming appointments, etc.  Non-urgent messages can be sent to your provider as well.   To learn more about what you can do with MyChart, go to [email or web address].    Your next appointment:   3 month(s)  The format for your next appointment:   In Person  Provider:   Huntley Dec, MD    Other Instructions none  Important Information About Sugar

## 2024-03-08 NOTE — Assessment & Plan Note (Signed)
 But has not atrial fibrillation, rate controlled. Continue stroke prophylaxis, anticoagulation Eliquis  2.5 mg twice daily considering her age, weight  Continue with metoprolol succinate 12.5 mg once daily.

## 2024-03-08 NOTE — Assessment & Plan Note (Signed)
 Target blood pressure below 140/90 mmHg. Continue metoprolol succinate 12.5 mg daily. Recently discontinued amlodipine  due to low blood pressures and orthostatic changes.

## 2024-03-08 NOTE — Assessment & Plan Note (Addendum)
 Associated with moderate pulmonary hypertension RVSP 54 mmHg on echocardiogram April 2024 with moderate TR.  Appears compensated, mild bilateral lower extremity edema which appears to be chronic, improves with keeping feet elevated.  Continue with furosemide  80 mg once daily. Continue with strict salt restriction in the diet below 2 g/day Continue with fluid restriction below 2 L/day.  Guideline-directed medical therapy limited due to elderly age, orthostatic blood pressure change concerns and fall risk. Continue with metoprolol succinate 12.5 mg once daily. Amlodipine  recently discontinued by primary team.  Target blood pressure below 140/90 mmHg. ,

## 2024-03-08 NOTE — Progress Notes (Signed)
 -  Cardiology Consultation:    Date:  03/08/2024   ID:  Karina Chase, DOB [birth year], MRN [id number]  PCP:  Durwood Fossa, MD  Cardiologist:  Alean SAUNDERS Krew Hortman, MD   Referring MD: Arloa Elsie SAUNDERS, MD   No chief complaint on file.    ASSESSMENT AND PLAN:   [name] 89 year old woman history of persistent atrial fibrillation, hypertension, chronic diastolic CHF, CKD stage III here for follow-up visit.  last echocardiogram April 2024 with EF 60 to 65%, RVSP 54 mmHg, mildly reduced RV function, dilated atria, mild MR, moderate TR.  Long-term resident of [facility], here for the visit accompanied by her daughter.  Problem List Items Addressed This Visit     Hypertensive heart disease   Target blood pressure below 140/90 mmHg. Continue metoprolol succinate 12.5 mg daily. Recently discontinued amlodipine  due to low blood pressures and orthostatic changes.       Relevant Medications   metoprolol succinate (TOPROL-XL) 25 MG 24 hr tablet   Atrial fibrillation (HCC) (Chronic)   But has not atrial fibrillation, rate controlled. Continue stroke prophylaxis, anticoagulation Eliquis  2.5 mg twice daily considering her age, weight  Continue with metoprolol succinate 12.5 mg once daily.       Relevant Medications   metoprolol succinate (TOPROL-XL) 25 MG 24 hr tablet   Other Relevant Orders   EKG 12-Lead (Completed)   Chronic diastolic CHF (congestive heart failure) (HCC) - Primary   Associated with moderate pulmonary hypertension RVSP 54 mmHg on echocardiogram April 2024 with moderate TR.  Appears compensated, mild bilateral lower extremity edema which appears to be chronic, improves with keeping feet elevated.  Continue with furosemide  80 mg once daily. Continue with strict salt restriction in the diet below 2 g/day Continue with fluid restriction below 2 L/day.  Guideline-directed medical therapy limited due to elderly age, orthostatic blood pressure change  concerns and fall risk. Continue with metoprolol succinate 12.5 mg once daily. Amlodipine  recently discontinued by primary team.  Target blood pressure below 140/90 mmHg. ,      Relevant Medications   metoprolol succinate (TOPROL-XL) 25 MG 24 hr tablet   Return to clinic tentatively in 3 months.   History of Present Illness:    Karina Chase is a 89 y.o. female who is being seen today for follow-up visit. PCP is Arloa Elsie SAUNDERS, MD. last visit with us  in the office was 05-16-2023 with Dr. Monetta.  Pleasant woman here for the visit today accompanied by her daughter.  Currently a  Resident of [facility]  Has history of persistent atrial fibrillation, hypertension, chronic diastolic CHF, CKD stage III here for follow-up visit.  last echocardiogram April 2024 with EF 60 to 65%, RVSP 54 mmHg, mildly reduced RV function, dilated atria, mild MR, moderate TR.  Has been doing well.  Weight has been relatively steady. Has mild bilateral lower extremity edema which has been stable over the past couple months per her daughter. She bumped up her right shin with her cane and has an open blister which is being cared for by the wound care team. Denies any chest pain, orthopnea paroxysmal nocturnal dyspnea. Uses a walker to ambulate. Denies any recent falls. Denies any significant bleeding concerns.  She is not aware of any palpitations and cannot tell whether she is in A-fib or not.  Blood work from 12-24-2022 noted NT proBNP elevated 2528 BUN 48, creatinine 1.33, EGFR 37. Sodium 133, potassium 4.7.  All Magnesium  2.3 Hemoglobin  9.6, hematocrit 29.4, platelets 213.  EKG in the clinic today shows atrial fibrillation with ventricular rate 61/min narrow QRS 98 ms with left axis suggestive of left anterior vascular block. In comparison prior EKG from December 23, 2022 also noted atrial fibrillation with ventricular rates controlled and isolated PVC.  Past Medical History:   Diagnosis Date   Arthritis    Asthma    Breast cancer (HCC)    Cancer (HCC)    Basal cell skin cancer   Chronic diastolic CHF (congestive heart failure) (HCC)    CKD stage 3b, GFR 30-44 ml/min (HCC)    Colon polyp    GERD (gastroesophageal reflux disease)    History of shingles    Hypertension    Hypertensive heart disease 05/30/2020   Persistent atrial fibrillation (HCC)    Pulmonary hypertension (HCC)    Right groin pain 03/28/2013   Stress incontinence    Tricuspid regurgitation    Varicose veins    Vertigo     Past Surgical History:  Procedure Laterality Date   ABDOMINAL HYSTERECTOMY  1972   BSO   BREAST SURGERY     lumpectomy   SKIN CANCER EXCISION  2014    Current Medications: Current Meds  Medication Sig   albuterol  (VENTOLIN  HFA) 108 (90 Base) MCG/ACT inhaler Inhale 1-2 puffs into the lungs every 6 (six) hours as needed for wheezing or shortness of breath.   amLODipine  (NORVASC ) 10 MG tablet Take 1 tablet (10 mg total) by mouth daily.   Cholecalciferol (VITAMIN D-3) 125 MCG (5000 UT) TABS Take 1 tablet by mouth daily.   cyanocobalamin (VITAMIN B12) 1000 MCG tablet Take 1,000 mcg by mouth daily.   DULERA  200-5 MCG/ACT AERO Inhale 2 puffs into the lungs 2 (two) times daily.   ELIQUIS  2.5 MG TABS tablet TAKE ONE TABLET BY MOUTH TWICE DAILY (Patient taking differently: Take 2.5 mg by mouth 2 (two) times daily.)   furosemide  (LASIX ) 40 MG tablet Take 40 mg by mouth 2 (two) times daily.   magnesium  hydroxide (MILK OF MAGNESIA) 400 MG/5ML suspension Take 30 mLs by mouth daily as needed for mild constipation.   metoprolol succinate (TOPROL-XL) 25 MG 24 hr tablet Take 12.5 mg by mouth daily.   nitroGLYCERIN  (NITROSTAT ) 0.4 MG SL tablet Place 1 tablet (0.4 mg total) under the tongue every 5 (five) minutes as needed for chest pain.   ondansetron  (ZOFRAN -ODT) 4 MG disintegrating tablet Take 4 mg by mouth every 8 (eight) hours.   polyethylene glycol (MIRALAX / GLYCOLAX) 17 g  packet Take 17 g by mouth daily as needed for mild constipation or moderate constipation.   senna (SENOKOT) 8.6 MG tablet Take 2 tablets by mouth at bedtime.   urea  (URE-NA) 15 g PACK oral packet Take 15 g by mouth 2 (two) times daily.   Current Facility-Administered Medications for the 03/08/24 encounter (Office Visit) with Violett Hobbs, Alean SAUNDERS, MD  Medication   triamcinolone  acetonide (KENALOG ) 10 MG/ML injection 10 mg     Allergies:   Levofloxacin, Keflex [cephalexin], Prilosec [omeprazole], Sulfa antibiotics, Sulfamethoxazole-trimethoprim, Azithromycin, Penicillin g, and Penicillins   Social History   Socioeconomic History   Marital status: Married    Spouse name: Not on file   Number of children: Not on file   Years of education: Not on file   Highest education level: Not on file  Occupational History   Not on file  Tobacco Use   Smoking status: Never   Smokeless tobacco: Never  Substance and  Sexual Activity   Alcohol use: No   Drug use: No   Sexual activity: Not Currently    Birth control/protection: Abstinence  Other Topics Concern   Not on file  Social History Narrative   Not on file   Social Drivers of Health   Financial Resource Strain: Not on file  Food Insecurity: Not on file  Transportation Needs: Not on file  Physical Activity: Not on file  Stress: Not on file  Social Connections: Not on file     Family History: The patient's family history includes Cancer in her maternal grandmother; Heart disease in her father; Stroke in her mother. ROS:   Please see the history of present illness.    All 14 point review of systems negative except as described per history of present illness.  EKGs/Labs/Other Studies Reviewed:    The following studies were reviewed today:   EKG:       Recent Labs: No results found for requested labs within last 365 days.  Recent Lipid Panel No results found for: CHOL, TRIG, HDL, CHOLHDL, VLDL, LDLCALC,  LDLDIRECT  Physical Exam:    VS:  BP (!) 142/60 (BP Location: Left Arm)   Pulse 61   Ht 5' 3 (1.6 m)   Wt 128 lb 12.8 oz (58.4 kg)   SpO2 94%   BMI 22.82 kg/m     Wt Readings from Last 3 Encounters:  03/08/24 128 lb 12.8 oz (58.4 kg)  05/26/23 137 lb (62.1 kg)  02/16/23 133 lb 6.4 oz (60.5 kg)     GENERAL:  Well nourished, well developed in no acute distress NECK: No JVD; No carotid bruits CARDIAC: Irregularly irregular, S1 and S2 present, 4/6 holosystolic murmur best heard parasternally on the left side.  CHEST:  Clear to auscultation without rales, wheezing or rhonchi  Extremities: No pitting pedal edema. Pulses bilaterally symmetric with radial 2+ and dorsalis pedis 2+ NEUROLOGIC:  Alert and oriented x 3  Medication Adjustments/Labs and Tests Ordered: Current medicines are reviewed at length with the patient today.  Concerns regarding medicines are outlined above.  Orders Placed This Encounter  Procedures   EKG 12-Lead   No orders of the defined types were placed in this encounter.   Signed, Alean jess Kobus, MD, MPH, [facility]. 03/08/2024 1:14 PM    [facility]

## 2024-03-21 ENCOUNTER — Telehealth: Payer: Self-pay | Admitting: Cardiology

## 2024-03-21 NOTE — Telephone Encounter (Signed)
 Spoke with Corean who states that the pt is going to monitor her fluid and salt intake but denied allowing dietary at the facility to monitor.

## 2024-03-21 NOTE — Telephone Encounter (Signed)
 Calling to speak to the nurse about  the patient 's fluid restriction. Please advise

## 2024-04-03 ENCOUNTER — Ambulatory Visit: Admitting: Cardiology

## 2024-04-25 ENCOUNTER — Ambulatory Visit: Payer: Self-pay

## 2024-04-25 NOTE — Progress Notes (Signed)
 Blood work from 02/11/2024 at [facility] reviewed NT proBNP elevated 2681 Hemoglobin 9.6, hematocrit 29.4 BUN 50.4, creatinine 1.24, EGFR 40 Stable findings in comparison to prior labs from 12/24/2022 as reviewed at office visit.

## 2024-07-03 NOTE — Progress Notes (Unsigned)
 Cardiology Office Note:    Date:  07/04/2024   ID:  Karina Chase, DOB 12-14-1924, MRN [id number]  PCP:  Durwood Fossa, MD  Cardiologist:  Redell Leiter, MD    Referring MD: Durwood Fossa, MD    ASSESSMENT:    1. Persistent atrial fibrillation (HCC)   2. Rash   3. Hypertensive heart disease with chronic diastolic congestive heart failure (HCC)   4. Stage 3 chronic kidney disease, unspecified whether stage 3a or 3b CKD (HCC)    PLAN:    In order of problems listed above:  Rate is controlled with atrial fibrillation with her beta-blocker and anticoagulant had been discontinued several months ago Profound rash appears to be almost vasculitis in appearance I told the daughter she needs to go back to dermatology may require a biopsy Markedly decompensated heart failure stressed with her she has to take a second dose of diuretic daily   Next appointment: 6 months   Medication Adjustments/Labs and Tests Ordered: Current medicines are reviewed at length with the patient today.  Concerns regarding medicines are outlined above.  Orders Placed This Encounter  Procedures   EKG 12-Lead   No orders of the defined types were placed in this encounter.    History of Present Illness:    Karina Chase is a 89 y.o. female with a hx of chronic atrial fibrillation with rate control and anticoagulation hypertensive heart disease with chronic diastolic heart failure and stage III CKD last seen 05/26/2023.  She is not doing well she has a progressive facial rash very violaceous involves the cheeks bilaterally as well as smaller areas throughout the face and looks to be almost a vasculitis appearance Overall she is doing terribly she has lower extremity edema oozing from her legs and she is extremely reluctant to take her diuretic because of incontinence. She has both a physician at the skilled nursing facility and [facility] nurses coming to check on her She seems quite despondent  talks about dying but she is not short of breath chest pain palpitation or syncope I do not have any labs She no longer has been taking her anticoagulant for several months Compliance with diet, lifestyle and medications: Yes Past Medical History:  Diagnosis Date   Arthritis    Asthma    Breast cancer (HCC)    Cancer (HCC)    Basal cell skin cancer   Chronic diastolic CHF (congestive heart failure) (HCC)    Chronic kidney disease 12/13/2020   CKD stage 3b, GFR 30-44 ml/min (HCC)    Colon polyp    Generalized edema 12/13/2020   GERD (gastroesophageal reflux disease)    Hearing loss 12/13/2020   History of shingles    Hypertension    Hypertensive heart disease 05/30/2020   Hyponatremia 12/18/2022   Overactive bladder 12/13/2020   Persistent atrial fibrillation (HCC)    Pulmonary hypertension (HCC)    Right groin pain 03/28/2013   Stress incontinence    Tricuspid regurgitation    Type 2 diabetes mellitus without complications (HCC) 12/13/2020   Vertigo     Current Medications: Current Meds  Medication Sig   albuterol  (VENTOLIN  HFA) 108 (90 Base) MCG/ACT inhaler Inhale 1-2 puffs into the lungs every 6 (six) hours as needed for wheezing or shortness of breath.   cyanocobalamin (VITAMIN B12) 1000 MCG tablet Take 1,000 mcg by mouth daily.   DULERA  200-5 MCG/ACT AERO Inhale 2 puffs into the lungs 2 (two) times daily.   furosemide  (LASIX ) 40 MG  tablet Take 40 mg by mouth 2 (two) times daily.   levalbuterol (XOPENEX) 1.25 MG/3ML nebulizer solution Take 1.25 mg by nebulization every 4 (four) hours as needed.   levothyroxine (SYNTHROID) 50 MCG tablet Take 50 mcg by mouth daily.   magnesium  hydroxide (MILK OF MAGNESIA) 400 MG/5ML suspension Take 30 mLs by mouth daily as needed for mild constipation.   metoprolol succinate (TOPROL-XL) 25 MG 24 hr tablet Take 12.5 mg by mouth daily.   nitroGLYCERIN  (NITROSTAT ) 0.4 MG SL tablet Place 1 tablet (0.4 mg total) under the tongue every 5 (five)  minutes as needed for chest pain.   ondansetron  (ZOFRAN -ODT) 4 MG disintegrating tablet Take 4 mg by mouth every 8 (eight) hours.   polyethylene glycol (MIRALAX / GLYCOLAX) 17 g packet Take 17 g by mouth daily as needed for mild constipation or moderate constipation.   senna (SENOKOT) 8.6 MG tablet Take 2 tablets by mouth at bedtime.   Current Facility-Administered Medications for the 07/04/24 encounter (Office Visit) with Monetta Redell PARAS, MD  Medication   triamcinolone  acetonide (KENALOG ) 10 MG/ML injection 10 mg      EKGs/Labs/Other Studies Reviewed:    The following studies were reviewed today:  Cardiac Studies & Procedures   ______________________________________________________________________________________________     ECHOCARDIOGRAM  ECHOCARDIOGRAM COMPLETE 12/19/2022  Narrative ECHOCARDIOGRAM REPORT    Patient Name:   Karina Chase Date of Exam: 12/19/2022 Medical Rec #:  [id number]        Height:       63.0 in Accession #:    [id number]       Weight:       154.3 lb Date of Birth:  Apr 21, 1925        BSA:          1.732 m Patient Age:    89 years         BP:           155/77 mmHg Patient Gender: F                HR:           81 bpm. Exam Location:  Inpatient  Procedure: 2D Echo, Cardiac Doppler and Color Doppler  Indications:    A flutter  History:        Patient has prior history of Echocardiogram examinations, most recent 06/20/2020. CHF, COPD, Signs/Symptoms:Chest Pain; Risk Factors:Hypertension.  Sonographer:    Melissa Morford RDCS (AE, PE) Referring Phys: [id number] JARED M GARDNER  IMPRESSIONS   1. Left ventricular ejection fraction, by estimation, is 60 to 65%. The left ventricle has normal function. The left ventricle has no regional wall motion abnormalities. There is mild left ventricular hypertrophy. Left ventricular diastolic function could not be evaluated. 2. Right ventricular systolic function is mildly reduced. The right ventricular size is  normal. There is moderately elevated pulmonary artery systolic pressure. The estimated right ventricular systolic pressure is 53.4 mmHg. 3. Left atrial size was mildly dilated. 4. Right atrial size was moderately dilated. 5. The mitral valve is normal in structure. Mild mitral valve regurgitation. No evidence of mitral stenosis. 6. The tricuspid valve is abnormal. Tricuspid valve regurgitation is moderate. 7. The aortic valve is tricuspid. Aortic valve regurgitation is not visualized. No aortic stenosis is present. 8. The inferior vena cava is normal in size with <50% respiratory variability, suggesting right atrial pressure of 8 mmHg.  Comparison(s): No significant change from prior study.  FINDINGS Left Ventricle: Left ventricular ejection fraction, by estimation,  is 60 to 65%. The left ventricle has normal function. The left ventricle has no regional wall motion abnormalities. The left ventricular internal cavity size was normal in size. There is mild left ventricular hypertrophy. Left ventricular diastolic function could not be evaluated due to atrial fibrillation. Left ventricular diastolic function could not be evaluated.  Right Ventricle: The right ventricular size is normal. No increase in right ventricular wall thickness. Right ventricular systolic function is mildly reduced. There is moderately elevated pulmonary artery systolic pressure. The tricuspid regurgitant velocity is 3.37 m/s, and with an assumed right atrial pressure of 8 mmHg, the estimated right ventricular systolic pressure is 53.4 mmHg.  Left Atrium: Left atrial size was mildly dilated.  Right Atrium: Right atrial size was moderately dilated.  Pericardium: There is no evidence of pericardial effusion.  Mitral Valve: The mitral valve is normal in structure. Mild mitral valve regurgitation. No evidence of mitral valve stenosis.  Tricuspid Valve: The tricuspid valve is abnormal. Tricuspid valve regurgitation is moderate  . No evidence of tricuspid stenosis.  Aortic Valve: The aortic valve is tricuspid. Aortic valve regurgitation is not visualized. No aortic stenosis is present.  Pulmonic Valve: The pulmonic valve was normal in structure. Pulmonic valve regurgitation is not visualized. No evidence of pulmonic stenosis.  Aorta: The aortic root and ascending aorta are structurally normal, with no evidence of dilitation.  Venous: The inferior vena cava is normal in size with less than 50% respiratory variability, suggesting right atrial pressure of 8 mmHg.  IAS/Shunts: No atrial level shunt detected by color flow Doppler.   LEFT VENTRICLE PLAX 2D LVIDd:         3.80 cm LVIDs:         2.80 cm LV PW:         1.20 cm LV IVS:        1.10 cm LVOT diam:     1.90 cm LV SV:         47 LV SV Index:   27 LVOT Area:     2.84 cm  LV Volumes (MOD) LV vol d, MOD A2C: 84.7 ml LV vol d, MOD A4C: 58.5 ml LV vol s, MOD A2C: 35.0 ml LV vol s, MOD A4C: 35.2 ml LV SV MOD A2C:     49.7 ml LV SV MOD A4C:     58.5 ml LV SV MOD BP:      34.5 ml  RIGHT VENTRICLE RV S prime:     9.90 cm/s TAPSE (M-mode): 1.6 cm  LEFT ATRIUM             Index        RIGHT ATRIUM           Index LA diam:        4.10 cm 2.37 cm/m   RA Area:     18.00 cm LA Vol (A2C):   81.8 ml 47.23 ml/m  RA Volume:   47.30 ml  27.31 ml/m LA Vol (A4C):   49.0 ml 28.29 ml/m LA Biplane Vol: 63.3 ml 36.55 ml/m AORTIC VALVE LVOT Vmax:   81.30 cm/s LVOT Vmean:  64.600 cm/s LVOT VTI:    0.165 m  AORTA Ao Root diam: 3.40 cm Ao Asc diam:  3.40 cm  MITRAL VALVE                TRICUSPID VALVE MV Area (PHT): 4.33 cm     TR Peak grad:   45.4 mmHg MV Decel Time: 175 msec  TR Vmax:        337.00 cm/s MV E velocity: 112.00 cm/s SHUNTS Systemic VTI:  0.16 m Systemic Diam: 1.90 cm  Vishnu Priya Mallipeddi Electronically signed by Diannah Late Mallipeddi Signature Date/Time: 12/19/2022/3:32:05 PM    Final    MONITORS  LONG TERM MONITOR  (3-14 DAYS) 10/24/2020  Narrative Patch Wear Time:  3 days and 4 hours (2022-02-08T13:24:08-499 to 2022-02-11T18:15:24-0500)  Atrial Fibrillation occurred continuously (100% burden), ranging from 39-162 bpm (avg of 70 bpm). Isolated VEs were rare (<1.0%), VE Couplets were rare (<1.0%), and no VE Triplets were present.  An event monitor was performed for 3 days and 4 hours assess heart rate response to atrial fibrillation. The cardiac rhythm throughout was atrial fibrillation with average rate of 70 minimum 39 maximum rate of 162 bpm. In general heart rate was well controlled in range 50 to 110 bpm 98% of the time today 97% of the time night and less than 1% greater than 150 bpm. There were no pauses of 3 seconds or greater. Ventricular ectopy was rare with isolated PVCs and couplets. There were no triggered or diary events.   Conclusion: atrial fibrillation with good heart rate control.       ______________________________________________________________________________________________      EKG Interpretation Date/Time:  Tuesday July 04 2024 13:17:48 EDT Ventricular Rate:  69 PR Interval:    QRS Duration:  94 QT Interval:  412 QTC Calculation: 441 R Axis:   171  Text Interpretation: Atrial fibrillation CONTROLLED VENTRICULAR RESPONSE Right axis deviation Pulmonary disease pattern Nonspecific ST abnormality When compared with ECG of 08-Mar-2024 11:35, Nonspecific T wave abnormality now evident in Lateral leads Confirmed by Monetta Rogue ([id number]) on 07/04/2024 1:29:28 PM   Recent Labs: No results found for requested labs within last 365 days.  Recent Lipid Panel No results found for: CHOL, TRIG, HDL, CHOLHDL, VLDL, LDLCALC, LDLDIRECT  Physical Exam:    VS:  BP 130/62   Pulse 69   Ht 5' 3 (1.6 m)   Wt 140 lb 6.4 oz (63.7 kg)   SpO2 99%   BMI 24.87 kg/m     Wt Readings from Last 3 Encounters:  07/04/24 140 lb 6.4 oz (63.7 kg)  03/08/24 128 lb 12.8 oz  (58.4 kg)  05/26/23 137 lb (62.1 kg)     GEN: Very thin frail and anxious with a profound facial violaceous rash symmetrical involves the cheeks malar area bilaterally HEENT: Normal NECK: No JVD; No carotid bruits LYMPHATICS: No lymphadenopathy CARDIAC: Variable first heart sound irregular rhythm  RESPIRATORY:  Clear to auscultation without rales, wheezing or rhonchi  ABDOMEN: Soft, non-tender, non-distended MUSCULOSKELETAL: She has profound lower extremity edema with wraps bilaterally edema; No deformity  SKIN: Warm and dry NEUROLOGIC:  Alert and oriented x 3 PSYCHIATRIC:  Normal affect    Signed, Rogue Monetta, MD  07/04/2024 1:45 PM    [facility]

## 2024-07-04 ENCOUNTER — Ambulatory Visit: Attending: Cardiology | Admitting: Cardiology

## 2024-07-04 VITALS — BP 130/62 | HR 69 | Ht 63.0 in | Wt 140.4 lb

## 2024-07-04 DIAGNOSIS — I4819 Other persistent atrial fibrillation: Secondary | ICD-10-CM

## 2024-07-04 DIAGNOSIS — R21 Rash and other nonspecific skin eruption: Secondary | ICD-10-CM | POA: Diagnosis not present

## 2024-07-04 DIAGNOSIS — N183 Chronic kidney disease, stage 3 unspecified: Secondary | ICD-10-CM

## 2024-07-04 DIAGNOSIS — I110 Hypertensive heart disease with heart failure: Secondary | ICD-10-CM | POA: Diagnosis not present

## 2024-07-04 DIAGNOSIS — I5032 Chronic diastolic (congestive) heart failure: Secondary | ICD-10-CM

## 2024-07-04 NOTE — Patient Instructions (Signed)
 Medication Instructions:  Your physician recommends that you continue on your current medications as directed. Please refer to the Current Medication list given to you today.  *If you need a refill on your cardiac medications before your next appointment, please call your pharmacy*  Lab Work: None If you have labs (blood work) drawn today and your tests are completely normal, you will receive your results only by: MyChart Message (if you have MyChart) OR A paper copy in the mail If you have any lab test that is abnormal or we need to change your treatment, we will call you to review the results.  Testing/Procedures: None  Follow-Up: At [facility], you and your health needs are our priority.  As part of our continuing mission to provide you with exceptional heart care, our providers are all part of one team.  This team includes your primary Cardiologist (physician) and Advanced Practice Providers or APPs (Physician Assistants and Nurse Practitioners) who all work together to provide you with the care you need, when you need it.  Your next appointment:   6 month(s)  Provider:   Redell Leiter, MD    We recommend signing up for the patient portal called MyChart.  Sign up information is provided on this After Visit Summary.  MyChart is used to connect with patients for Virtual Visits (Telemedicine).  Patients are able to view lab/test results, encounter notes, upcoming appointments, etc.  Non-urgent messages can be sent to your provider as well.   To learn more about what you can do with MyChart, go to [email or web address].   Other Instructions Follow up with dermatology for facial rash regarding vasculitis.

## 2024-07-04 NOTE — Addendum Note (Signed)
 Addended by: SHERRE ADE I on: 07/04/2024 03:06 PM   Modules accepted: Orders

## 2024-07-10 ENCOUNTER — Ambulatory Visit: Admitting: Physician Assistant

## 2024-07-10 ENCOUNTER — Encounter: Payer: Self-pay | Admitting: Physician Assistant

## 2024-07-10 VITALS — BP 155/85

## 2024-07-10 DIAGNOSIS — D692 Other nonthrombocytopenic purpura: Secondary | ICD-10-CM | POA: Diagnosis not present

## 2024-07-10 DIAGNOSIS — R600 Localized edema: Secondary | ICD-10-CM | POA: Diagnosis not present

## 2024-07-10 NOTE — Progress Notes (Signed)
   New Patient Visit   Subjective  Eliah B Denzler is a 89 y.o. female NEW PATIENT who presents for the following: Facial rash x ~ 2 months and it has gotten worse. Her cardiologist wanted her evaluated for vasculitis.She has a history of skin cancer but she is not up for a lot of procedures. Her room mate had a virus and she caught it and she was coughing. The doctor at the nursing home where she lives told her it was broken blood vessels from coughing but her cardiologist disagrees.   Accompanied by daughter.  The following portions of the chart were reviewed this encounter and updated as appropriate: medications, allergies, medical history  Review of Systems:  No other skin or systemic complaints except as noted in HPI or Assessment and Plan.  Objective  Well appearing patient in no apparent distress; mood and affect are within normal limits.   A focused examination was performed of the following areas: Face   Relevant exam findings are noted in the Assessment and Plan.            Assessment & Plan   LOWER EXTREMITY EDEMA - SEVERE  Exam: Extreme swelling of legs (entire legs to her buttocks) with 3+ pitting edema   Treatment Plan: Recommend evaluation with cardiologist/PCP.   Purpura - Chronic; persistent and recurrent.  Treatable, but not curable. - Violaceous macules and patches of face - likely from coughing  - Violaceous macules and patches on arms - likely from age  - Benign - Related to trauma, age, sun damage and/or use of blood thinners, chronic use of topical and/or oral steroids - offered biopsy to confirm but patient declined  BILATERAL LOWER EXTREMITY EDEMA   OTHER NONTHROMBOCYTOPENIC PURPURA    Return if symptoms worsen or fail to improve.  I, Roseline Hutchinson, CMA, am acting as scribe for Aryanah Enslow K, PA-C .   Documentation: I have reviewed the above documentation for accuracy and completeness, and I agree with the  above.  Rober Skeels K, PA-C

## 2024-07-10 NOTE — Patient Instructions (Signed)

## 2024-08-07 DEATH — deceased
